# Patient Record
Sex: Male | Born: 1980 | Race: White | Hispanic: No | Marital: Married | Smoking: Never smoker
Health system: Southern US, Community
[De-identification: ages and names within clinical notes are randomized; demographics above are authoritative.]

## PROBLEM LIST (undated history)

## (undated) DIAGNOSIS — R0981 Nasal congestion: Secondary | ICD-10-CM

## (undated) HISTORY — PX: NASAL SINUS SURGERY: SHX719

## (undated) HISTORY — PX: APPENDECTOMY: SHX54

---

## 2005-05-29 ENCOUNTER — Ambulatory Visit: Payer: Self-pay | Admitting: Internal Medicine

## 2009-09-15 ENCOUNTER — Ambulatory Visit (HOSPITAL_COMMUNITY): Admission: EM | Admit: 2009-09-15 | Discharge: 2009-09-16 | Payer: Self-pay | Admitting: Emergency Medicine

## 2009-09-16 ENCOUNTER — Encounter (INDEPENDENT_AMBULATORY_CARE_PROVIDER_SITE_OTHER): Payer: Self-pay | Admitting: Surgery

## 2010-10-13 LAB — COMPREHENSIVE METABOLIC PANEL
ALT: 27 U/L (ref 0–53)
AST: 28 U/L (ref 0–37)
Albumin: 4 g/dL (ref 3.5–5.2)
Alkaline Phosphatase: 56 U/L (ref 39–117)
BUN: 9 mg/dL (ref 6–23)
CO2: 29 mEq/L (ref 19–32)
Calcium: 9.4 mg/dL (ref 8.4–10.5)
Chloride: 102 mEq/L (ref 96–112)
Creatinine, Ser: 1.02 mg/dL (ref 0.4–1.5)
GFR calc Af Amer: >60 mL/min (ref >60)
GFR calc non Af Amer: >60 mL/min (ref >60)
Glucose, Bld: 111 mg/dL — ABNORMAL HIGH (ref 70–99)
Potassium: 3.4 mEq/L — ABNORMAL LOW (ref 3.5–5.1)
Sodium: 140 mEq/L (ref 135–145)
Total Bilirubin: 1.3 mg/dL — ABNORMAL HIGH (ref 0.3–1.2)
Total Protein: 7.6 g/dL (ref 6.0–8.3)

## 2010-10-13 LAB — CBC
HCT: 45 % (ref 39.0–52.0)
Hemoglobin: 15.2 g/dL (ref 13.0–17.0)
MCHC: 33.7 g/dL (ref 30.0–36.0)
MCV: 91.4 fL (ref 78.0–100.0)
Platelets: 179 10*3/uL (ref 150–400)
RBC: 4.92 MIL/uL (ref 4.22–5.81)
RDW: 12.7 % (ref 11.5–15.5)
WBC: 9.8 10*3/uL (ref 4.0–10.5)

## 2010-10-13 LAB — URINALYSIS, ROUTINE W REFLEX MICROSCOPIC
Bilirubin Urine: NEGATIVE
Glucose, UA: NEGATIVE mg/dL
Hgb urine dipstick: NEGATIVE
Ketones, ur: NEGATIVE mg/dL
Nitrite: NEGATIVE
Protein, ur: NEGATIVE mg/dL
Specific Gravity, Urine: 1.004 — ABNORMAL LOW (ref 1.005–1.030)
Urobilinogen, UA: 0.2 mg/dL (ref 0.0–1.0)
pH: 6 (ref 5.0–8.0)

## 2010-10-13 LAB — DIFFERENTIAL
Basophils Absolute: 0 10*3/uL (ref 0.0–0.1)
Basophils Relative: 0 % (ref 0–1)
Eosinophils Absolute: 0.1 10*3/uL (ref 0.0–0.7)
Eosinophils Relative: 1 % (ref 0–5)
Lymphocytes Relative: 23 % (ref 12–46)
Lymphs Abs: 2.3 10*3/uL (ref 0.7–4.0)
Monocytes Absolute: 0.9 10*3/uL (ref 0.1–1.0)
Monocytes Relative: 10 % (ref 3–12)
Neutro Abs: 6.4 10*3/uL (ref 1.7–7.7)
Neutrophils Relative %: 66 % (ref 43–77)

## 2010-10-13 LAB — GLUCOSE, CAPILLARY: Glucose-Capillary: 110 mg/dL — ABNORMAL HIGH (ref 70–99)

## 2010-10-13 LAB — LIPASE, BLOOD: Lipase: 64 U/L — ABNORMAL HIGH (ref 11–59)

## 2016-08-07 DIAGNOSIS — Z0000 Encounter for general adult medical examination without abnormal findings: Secondary | ICD-10-CM | POA: Diagnosis not present

## 2016-08-07 DIAGNOSIS — Z3009 Encounter for other general counseling and advice on contraception: Secondary | ICD-10-CM | POA: Diagnosis not present

## 2016-08-07 DIAGNOSIS — B351 Tinea unguium: Secondary | ICD-10-CM | POA: Diagnosis not present

## 2016-09-19 DIAGNOSIS — Z3009 Encounter for other general counseling and advice on contraception: Secondary | ICD-10-CM | POA: Diagnosis not present

## 2016-10-11 DIAGNOSIS — L719 Rosacea, unspecified: Secondary | ICD-10-CM | POA: Diagnosis not present

## 2016-12-21 DIAGNOSIS — L503 Dermatographic urticaria: Secondary | ICD-10-CM | POA: Diagnosis not present

## 2017-05-23 DIAGNOSIS — J301 Allergic rhinitis due to pollen: Secondary | ICD-10-CM | POA: Diagnosis not present

## 2017-05-23 DIAGNOSIS — L209 Atopic dermatitis, unspecified: Secondary | ICD-10-CM | POA: Diagnosis not present

## 2017-05-23 DIAGNOSIS — J3081 Allergic rhinitis due to animal (cat) (dog) hair and dander: Secondary | ICD-10-CM | POA: Diagnosis not present

## 2017-05-23 DIAGNOSIS — J3089 Other allergic rhinitis: Secondary | ICD-10-CM | POA: Diagnosis not present

## 2017-12-11 NOTE — Progress Notes (Signed)
[facility] [address] [place], [place] [address] Phone: [phone] Subjective:     CC: Left arm pain  [id number]  Wayne [name] is a 37 y.o. male coming in with complaint of left arm numbness. He sleeps on his left arm and noticed the other day when using his cell phone that he had some tingling down his arm. Can pop his shoulder which causes tingling up into his neck. Intermittent symptoms. Also notes a tightness in the forearm. Patient is right handed.  States that sometimes the pain is severe 9 out of 10.  Can affect daily activities.    No past medical history on file.  Social History   Socioeconomic History  . Marital status: Married    Spouse name: Not on file  . Number of children: Not on file  . Years of education: Not on file  . Highest education level: Not on file  Occupational History  . Not on file  Social Needs  . Financial resource strain: Not on file  . Food insecurity:    Worry: Not on file    Inability: Not on file  . Transportation needs:    Medical: Not on file    Non-medical: Not on file  Tobacco Use  . Smoking status: Not on file  Substance and Sexual Activity  . Alcohol use: Not on file  . Drug use: Not on file  . Sexual activity: Not on file  Lifestyle  . Physical activity:    Days per week: Not on file    Minutes per session: Not on file  . Stress: Not on file  Relationships  . Social connections:    Talks on phone: Not on file    Gets together: Not on file    Attends religious service: Not on file    Active member of club or organization: Not on file    Attends meetings of clubs or organizations: Not on file    Relationship status: Not on file  Other Topics Concern  . Not on file  Social History Narrative  . Not on file   Allergies not on file No family history on file.  No family history of autoimmune   Past medical history, social, surgical and family history all reviewed in electronic medical  record.  No pertanent information unless stated regarding to the chief complaint.   Review of Systems:Review of systems updated and as accurate as of 12/12/17  No headache, visual changes, nausea, vomiting, diarrhea, constipation, dizziness, abdominal pain, skin rash, fevers, chills, night sweats, weight loss, swollen lymph nodes, body aches, joint swelling, muscle aches, chest pain, shortness of breath, mood changes.   Objective  Blood pressure 128/88, pulse 73, height  (1.778 m), weight 195 lb (88.5 kg), SpO2 99 %. Systems examined below as of 12/12/17   General: No apparent distress alert and oriented x3 mood and affect normal, dressed appropriately.  HEENT: Pupils equal, extraocular movements intact  Respiratory: Patient's speak in full sentences and does not appear short of breath  Cardiovascular: No lower extremity edema, non tender, no erythema  Skin: Warm dry intact with no signs of infection or rash on extremities or on axial skeleton.  Abdomen: Soft nontender  Neuro: Cranial nerves II through XII are intact, neurovascularly intact in all extremities with 2+ DTRs and 2+ pulses.  Lymph: No lymphadenopathy of posterior or anterior cervical chain or axillae bilaterally.  Gait normal with Minckler balance and coordination.  MSK:  Non tender with full range of motion and Taborda stability and symmetric strength and tone of  elbows, wrist, hip, knee and ankles bilaterally.   Neck: Inspection loss of lordosis. No palpable stepoffs. Mild positive Spurling's maneuver. Lacks last 5 degrees of sidebending to the left Significant weakness with 3 out of 5 strength of the C8 distribution on the left compared to the right No sensory change to C4 to T1 Negative Hoffman sign bilaterally Reflexes 1+ of the triceps on the left Severe winging of the scapula with scapular dyskinesis noted but otherwise shoulder exam unremarkable   Impression and Recommendations:     This case required medical  decision making of moderate complexity.      Note: This dictation was prepared with Dragon dictation along with smaller phrase technology. Any transcriptional errors that result from this process are unintentional.

## 2017-12-12 ENCOUNTER — Ambulatory Visit (INDEPENDENT_AMBULATORY_CARE_PROVIDER_SITE_OTHER)
Admission: RE | Admit: 2017-12-12 | Discharge: 2017-12-12 | Disposition: A | Discharge status: Home / Self Care | Payer: BLUE CROSS/BLUE SHIELD | Source: Ambulatory Visit | Attending: Family Medicine | Admitting: Family Medicine

## 2017-12-12 ENCOUNTER — Ambulatory Visit: Payer: BLUE CROSS/BLUE SHIELD | Admitting: Family Medicine

## 2017-12-12 VITALS — BP 128/88 | HR 73 | Ht 70.0 in | Wt 195.0 lb

## 2017-12-12 DIAGNOSIS — M25512 Pain in left shoulder: Secondary | ICD-10-CM | POA: Diagnosis not present

## 2017-12-12 DIAGNOSIS — M5412 Radiculopathy, cervical region: Secondary | ICD-10-CM

## 2017-12-12 DIAGNOSIS — G2589 Other specified extrapyramidal and movement disorders: Secondary | ICD-10-CM | POA: Insufficient documentation

## 2017-12-12 DIAGNOSIS — M542 Cervicalgia: Secondary | ICD-10-CM | POA: Diagnosis not present

## 2017-12-12 MED ORDER — GABAPENTIN 100 MG PO CAPS: 200.0000 mg | ORAL_CAPSULE | Freq: Every day | ORAL | 3 refills | Status: DC

## 2017-12-12 NOTE — Assessment & Plan Note (Signed)
Discussed instability and ergonomics.  We will give more precise exercises after radicular symptoms have improved

## 2017-12-12 NOTE — Assessment & Plan Note (Signed)
Patient does have cervical radiculopathy in the C8 distribution.  Weakness noted.  Mild decrease in deep tendon reflexes.  Patient unable to tolerate prednisone.  Will give Duexis instead in 4 to 6 days.  We discussed icing regimen, gabapentin at night.  Follow-up again in 1 week

## 2017-12-12 NOTE — Patient Instructions (Signed)
Barney to see you  Ice 20 minutes 2 times daily. Usually after activity and before bed. Xrays downstairs today  Duexis 3 times a day for 6 days Gabapentin  at night See me again in 1 week (ok to double book)

## 2017-12-17 NOTE — Progress Notes (Signed)
[facility] [address] [place], [place] [address] Phone: [phone] Subjective:    CC: neck pain   [id number]  Wayne [name] is a 36 y.o. male coming in with complaint of neck pain.  Patient was seen previously and had more of the C8 radicular symptoms with some weakness of the left hand.  Patient has been noncompliant has not picked up any of the medication.  Has been 1 week.  States though that he does feel like it is getting a little bit better.  Still has some of the weakness.  Still has pain at baseline.   X-rays were independently visualized by me x-rays of the neck are unremarkable  History reviewed. No pertinent past medical history. History reviewed. No pertinent surgical history. Social History   Socioeconomic History  . Marital status: Married    Spouse name: Not on file  . Number of children: Not on file  . Years of education: Not on file  . Highest education level: Not on file  Occupational History  . Not on file  Social Needs  . Financial resource strain: Not on file  . Food insecurity:    Worry: Not on file    Inability: Not on file  . Transportation needs:    Medical: Not on file    Non-medical: Not on file  Tobacco Use  . Smoking status: Never Smoker  . Smokeless tobacco: Never Used  Substance and Sexual Activity  . Alcohol use: Not on file  . Drug use: Not on file  . Sexual activity: Not on file  Lifestyle  . Physical activity:    Days per week: Not on file    Minutes per session: Not on file  . Stress: Not on file  Relationships  . Social connections:    Talks on phone: Not on file    Gets together: Not on file    Attends religious service: Not on file    Active member of club or organization: Not on file    Attends meetings of clubs or organizations: Not on file    Relationship status: Not on file  Other Topics Concern  . Not on file  Social History Narrative  . Not on file   Not on File History reviewed.  No pertinent family history.  No family history of autoimmune   Past medical history, social, surgical and family history all reviewed in electronic medical record.  No pertanent information unless stated regarding to the chief complaint.   Review of Systems:Review of systems updated and as accurate as of 12/18/17  No headache, visual changes, nausea, vomiting, diarrhea, constipation, dizziness, abdominal pain, skin rash, fevers, chills, night sweats, w chest pain, shortness of breath, mood changes.  Positive muscle aches  Objective  Blood pressure 122/80, pulse 75, height  (1.778 m), weight 198 lb (89.8 kg), SpO2 98 %. Systems examined below as of 12/18/17   General: No apparent distress alert and oriented x3 mood and affect normal, dressed appropriately.  HEENT: Pupils equal, extraocular movements intact  Respiratory: Patient's speak in full sentences and does not appear short of breath  Cardiovascular: No lower extremity edema, non tender, no erythema  Skin: Warm dry intact with no signs of infection or rash on extremities or on axial skeleton.  Abdomen: Soft nontender  Neuro: Cranial nerves II through XII are intact, neurovascularly intact in all extremities with 2+ DTRs and 2+ pulses.  Lymph: No lymphadenopathy of posterior or anterior cervical  chain or axillae bilaterally.  Gait normal with Biever balance and coordination.  MSK:  Non tender with full range of motion and Alper stability and symmetric strength and tone of shoulders, elbows, wrist, hip, knee and ankles bilaterally.  Neck: Inspection mild loss of lordosis. No palpable stepoffs. Negative Spurling's maneuver. Still lacking last 10 degrees of extension. Grip strength mildly weak on the left side Still weakness in the C8 distribution Negative Hoffman sign bilaterally Reflexes normal  Osteopathic findings C2 flexed rotated and side bent right C7 flexed rotated and side bent left T3 extended rotated and side bent  right inhaled third rib T8 extended rotated and side bent left L3 flexed rotated and side bent right Sacrum right on right    Impression and Recommendations:     This case required medical decision making of moderate complexity.      Note: This dictation was prepared with Dragon dictation along with smaller phrase technology. Any transcriptional errors that result from this process are unintentional.

## 2017-12-18 ENCOUNTER — Encounter: Payer: Self-pay | Admitting: Family Medicine

## 2017-12-18 ENCOUNTER — Ambulatory Visit: Payer: BLUE CROSS/BLUE SHIELD | Admitting: Family Medicine

## 2017-12-18 DIAGNOSIS — M999 Biomechanical lesion, unspecified: Secondary | ICD-10-CM | POA: Diagnosis not present

## 2017-12-18 DIAGNOSIS — M5412 Radiculopathy, cervical region: Secondary | ICD-10-CM | POA: Diagnosis not present

## 2017-12-18 NOTE — Patient Instructions (Signed)
Mcisaac to see you  Enjoy target  Try the gabapentin at night Tried manipulation today and I hope it helps Exercises 3 times a week.   See em again in 4 weeks

## 2017-12-18 NOTE — Assessment & Plan Note (Signed)
Decision today to treat with OMT was based on Physical Exam  After verbal consent patient was treated with HVLA, ME, FPR techniques in cervical, thoracic, lumbar and sacral areas  Patient tolerated the procedure well with improvement in symptoms  Patient given exercises, stretches and lifestyle modifications  See medications in patient instructions if given  Patient will follow up in 4 weeks 

## 2017-12-18 NOTE — Assessment & Plan Note (Signed)
Patient is making very mild improvement.  Continues to have weakness on the C8 distribution.  Continue to monitor.  Encourage the gabapentin at night to see if that will be beneficial.  Monitor the weakness.  Responded well to manipulation.  Follow-up again in 4 weeks

## 2018-01-29 NOTE — Progress Notes (Signed)
[facility] [address] [place], [place] [address] Phone: [phone] Subjective:     CC: Neck pain follow-up  BJY:[id number]:Subjective  Wayne [name] is a 37 y.o. male coming in with complaint of neck pain.  Patient was seen previously and had more of a cervical radiculopathy with some weakness in the C8 distribution on the left that was improving.  Patient did respond well to manipulation.  Continues to make some progress.  Denies any numbness or tingling and has not noticed any increasing weakness.     No past medical history on file. No past surgical history on file. Social History   Socioeconomic History  . Marital status: Married    Spouse name: Not on file  . Number of children: Not on file  . Years of education: Not on file  . Highest education level: Not on file  Occupational History  . Not on file  Social Needs  . Financial resource strain: Not on file  . Food insecurity:    Worry: Not on file    Inability: Not on file  . Transportation needs:    Medical: Not on file    Non-medical: Not on file  Tobacco Use  . Smoking status: Never Smoker  . Smokeless tobacco: Never Used  Substance and Sexual Activity  . Alcohol use: Not on file  . Drug use: Not on file  . Sexual activity: Not on file  Lifestyle  . Physical activity:    Days per week: Not on file    Minutes per session: Not on file  . Stress: Not on file  Relationships  . Social connections:    Talks on phone: Not on file    Gets together: Not on file    Attends religious service: Not on file    Active member of club or organization: Not on file    Attends meetings of clubs or organizations: Not on file    Relationship status: Not on file  Other Topics Concern  . Not on file  Social History Narrative  . Not on file   Not on File No family history on file.  No family history of autoimmune   Past medical history, social, surgical and family history all reviewed in electronic  medical record.  No pertanent information unless stated regarding to the chief complaint.   Review of Systems:Review of systems updated and as accurate as of 01/30/18  No headache, visual changes, nausea, vomiting, diarrhea, constipation, dizziness, abdominal pain, skin rash, fevers, chills, night sweats, weight loss, swollen lymph nodes, body aches, joint swelling, muscle aches, chest pain, shortness of breath, mood changes.   Objective  Blood pressure 120/78, pulse (!) 57, height 5\' 10"  (1.778 m), weight 191 lb (86.6 kg), SpO2 98 %. Systems examined below as of 01/30/18   General: No apparent distress alert and oriented x3 mood and affect normal, dressed appropriately.  HEENT: Pupils equal, extraocular movements intact  Respiratory: Patient's speak in full sentences and does not appear short of breath  Cardiovascular: No lower extremity edema, non tender, no erythema  Skin: Warm dry intact with no signs of infection or rash on extremities or on axial skeleton.  Abdomen: Soft nontender  Neuro: Cranial nerves II through XII are intact, neurovascularly intact in all extremities with 2+ DTRs and 2+ pulses.  Lymph: No lymphadenopathy of posterior or anterior cervical chain or axillae bilaterally.  Gait normal with Breeding balance and coordination.  MSK:  Non tender with full  range of motion and Gilbert stability and symmetric strength and tone of shoulders, elbows, wrist, hip, knee and ankles bilaterally.  Neck: Inspection mild loss of lordosis. No palpable stepoffs. Negative Spurling's maneuver. Mild limitation especially with left-sided rotation and sidebending Grip strength and sensation normal in bilateral hands Strength Przybylski C4 to T1 distribution No sensory change to C4 to T1 Negative Hoffman sign bilaterally Reflexes normal Tender to palpation little bit over the right trapezius  Osteopathic findings  C2 flexed rotated and side bent left  C6 flexed rotated and side bent left T3  extended rotated and side bent left inhaled third rib L2 flexed rotated and side bent right      Impression and Recommendations:     This case required medical decision making of moderate complexity.      Note: This dictation was prepared with Dragon dictation along with smaller phrase technology. Any transcriptional errors that result from this process are unintentional.

## 2018-01-30 ENCOUNTER — Ambulatory Visit: Payer: BLUE CROSS/BLUE SHIELD | Admitting: Family Medicine

## 2018-01-30 ENCOUNTER — Encounter: Payer: Self-pay | Admitting: Family Medicine

## 2018-01-30 VITALS — BP 120/78 | HR 57 | Ht 70.0 in | Wt 191.0 lb

## 2018-01-30 DIAGNOSIS — M999 Biomechanical lesion, unspecified: Secondary | ICD-10-CM | POA: Diagnosis not present

## 2018-01-30 DIAGNOSIS — M5412 Radiculopathy, cervical region: Secondary | ICD-10-CM

## 2018-01-30 NOTE — Assessment & Plan Note (Signed)
Decision today to treat with OMT was based on Physical Exam  After verbal consent patient was treated with HVLA, ME, FPR techniques in cervical, thoracic,  lumbarareas  Patient tolerated the procedure well with improvement in symptoms  Patient given exercises, stretches and lifestyle modifications  See medications in patient instructions if given  Patient will follow up in 6-8 weeks 

## 2018-01-30 NOTE — Patient Instructions (Signed)
Hershberger to see you  Keep it up  Gabapentin as needed See me again in 2 months

## 2018-01-30 NOTE — Assessment & Plan Note (Signed)
Improvement in strength noted.  Encourage patient to continue to take the gabapentin and patient is taking it more on an as-needed basis.  Patient has been doing relatively better and is responding well to osteopathic manipulation.  Patient will follow-up again in 6 to 8 weeks

## 2018-04-29 ENCOUNTER — Ambulatory Visit: Payer: BLUE CROSS/BLUE SHIELD | Admitting: Family Medicine

## 2018-04-29 ENCOUNTER — Encounter: Payer: Self-pay | Admitting: Family Medicine

## 2018-04-29 VITALS — BP 128/80 | HR 58 | Temp 98.0°F | Ht 70.0 in | Wt 196.0 lb

## 2018-04-29 DIAGNOSIS — H6981 Other specified disorders of Eustachian tube, right ear: Secondary | ICD-10-CM | POA: Diagnosis not present

## 2018-04-29 DIAGNOSIS — J069 Acute upper respiratory infection, unspecified: Secondary | ICD-10-CM | POA: Diagnosis not present

## 2018-04-29 NOTE — Progress Notes (Signed)
Subjective:  Patient ID: Wayne [name], male    DOB: 1981/04/13  Age: 37 y.o. MRN: [id number]  CC: Establish Care (right ear has drainage)   HPI Wayne [name] presents for evaluation of a 2-day history of nasal congestion postnasal drip with right ear congestion and discomfort.  He has run no fever or chills.  There is been no cough to speak he denies wheezing, nausea vomiting, facial pressure teeth pain.  He does have a history of ongoing allergy rhinitis.  He is allergic to a long list of things.  He had asthma as a child but has outgrown it.  He has not had issues with asthma as an adult.  He is scheduled for consultation for vasectomy.  He does not smoke.  His 57-year-old daughter is at home with an ear infection.  History Wayne [name] has no past medical history on file.   He has no past surgical history on file.   His family history is not on file.He reports that he has never smoked. He has never used smokeless tobacco. His alcohol and drug histories are not on file.  Outpatient Medications Prior to Visit  Medication Sig Dispense Refill  . cetirizine (ZYRTEC) 5 MG chewable tablet Chew 5 mg by mouth daily.    Marland Kitchen gabapentin (NEURONTIN) 100 MG capsule Take 2 capsules (200 mg total) by mouth at bedtime. 60 capsule 3   No facility-administered medications prior to visit.     ROS Review of Systems  Constitutional: Negative for chills, diaphoresis, fatigue, fever and unexpected weight change.  HENT: Positive for congestion, hearing loss, postnasal drip, sneezing and sore throat. Negative for ear discharge, ear pain, rhinorrhea, sinus pressure, sinus pain and trouble swallowing.   Eyes: Negative for photophobia and visual disturbance.  Respiratory: Negative for cough, chest tightness and wheezing.   Cardiovascular: Negative.   Endocrine: Negative for polyphagia and polyuria.  Genitourinary: Negative.   Musculoskeletal: Negative for arthralgias and myalgias.  Skin: Negative.   Allergic/Immunologic:  Negative for immunocompromised state.  Neurological: Negative for light-headedness and headaches.  Hematological: Does not bruise/bleed easily.  Psychiatric/Behavioral: Negative.     Objective:  BP 128/80   Pulse (!) 58   Temp 98 F (36.7 C) (Oral)   Ht 5\' 10"  (1.778 m)   Wt 196 lb (88.9 kg)   SpO2 97%   BMI 28.12 kg/m   Physical Exam  Constitutional: He is oriented to person, place, and time. He appears well-developed and well-nourished. No distress.  HENT:  Head: Normocephalic and atraumatic.  Right Ear: External ear normal. Tympanic membrane is scarred and retracted. Tympanic membrane is not injected, not perforated and not erythematous.  Left Ear: External ear normal. Tympanic membrane is not injected, not scarred, not perforated, not erythematous and not retracted.  Mouth/Throat: Oropharynx is clear and moist. No oropharyngeal exudate.  Eyes: Pupils are equal, round, and reactive to light. Conjunctivae and EOM are normal. Right eye exhibits no discharge. Left eye exhibits no discharge. No scleral icterus.  Neck: Neck supple. No JVD present. No tracheal deviation present. No thyromegaly present.  Cardiovascular: Normal rate, regular rhythm and normal heart sounds.  Pulmonary/Chest: Effort normal and breath sounds normal. No respiratory distress. He has no wheezes. He has no rales.  Abdominal: Bowel sounds are normal.  Lymphadenopathy:    He has no cervical adenopathy.  Neurological: He is alert and oriented to person, place, and time.  Skin: Skin is warm and dry. No rash noted. He is not diaphoretic. No erythema.  Psychiatric: He has a normal mood and affect. His behavior is normal.      Assessment & Plan:   Semir was seen today for establish care.  Diagnoses and all orders for this visit:  Viral upper respiratory tract infection  Dysfunction of right eustachian tube   I have discontinued Wayne [name]'s cetirizine and gabapentin.  No orders of the defined types were  placed in this encounter.  Patient will use a decongestant.  Discussed options of Mucinex D versus zyrtec D.  He will follow-up in a week if he does not improve or if things get worse.  He will follow-up at some point after his vasectomy for physical exam.  Discussed the need for him to follow-up in the future with any significant wheezing for evaluation.  Follow-up: Return if symptoms worsen or fail to improve.  Mliss Sax, MD

## 2018-04-29 NOTE — Patient Instructions (Signed)
Eustachian Tube Dysfunction The eustachian tube connects the middle ear to the back of the nose. It regulates air pressure in the middle ear by allowing air to move between the ear and nose. It also helps to drain fluid from the middle ear space. When the eustachian tube does not function properly, air pressure, fluid, or both can build up in the middle ear. Eustachian tube dysfunction can affect one or both ears. What are the causes? This condition happens when the eustachian tube becomes blocked or cannot open normally. This may result from:  Ear infections.  Colds and other upper respiratory infections.  Allergies.  Irritation, such as from cigarette smoke or acid from the stomach coming up into the esophagus (gastroesophageal reflux).  Sudden changes in air pressure, such as from descending in an airplane.  Abnormal growths in the nose or throat, such as nasal polyps, tumors, or enlarged tissue at the back of the throat (adenoids).  What increases the risk? This condition may be more likely to develop in people who smoke and people who are overweight. Eustachian tube dysfunction may also be more likely to develop in children, especially children who have:  Certain birth defects of the mouth, such as cleft palate.  Large tonsils and adenoids.  What are the signs or symptoms? Symptoms of this condition may include:  A feeling of fullness in the ear.  Ear pain.  Clicking or popping noises in the ear.  Ringing in the ear.  Hearing loss.  Loss of balance.  Symptoms may get worse when the air pressure around you changes, such as when you travel to an area of high elevation or fly on an airplane. How is this diagnosed? This condition may be diagnosed based on:  Your symptoms.  A physical exam of your ear, nose, and throat.  Tests, such as those that measure: ? The movement of your eardrum (tympanogram). ? Your hearing (audiometry).  How is this treated? Treatment  depends on the cause and severity of your condition. If your symptoms are mild, you may be able to relieve your symptoms by moving air into ("popping") your ears. If you have symptoms of fluid in your ears, treatment may include:  Decongestants.  Antihistamines.  Nasal sprays or ear drops that contain medicines that reduce swelling (steroids).  In some cases, you may need to have a procedure to drain the fluid in your eardrum (myringotomy). In this procedure, a small tube is placed in the eardrum to:  Drain the fluid.  Restore the air in the middle ear space.  Follow these instructions at home:  Take over-the-counter and prescription medicines only as told by your health care provider.  Use techniques to help pop your ears as recommended by your health care provider. These may include: ? Chewing gum. ? Yawning. ? Frequent, forceful swallowing. ? Closing your mouth, holding your nose closed, and gently blowing as if you are trying to blow air out of your nose.  Do not do any of the following until your health care provider approves: ? Travel to high altitudes. ? Fly in airplanes. ? Work in a pressurized cabin or room. ? Scuba dive.  Keep your ears dry. Dry your ears completely after showering or bathing.  Do not smoke.  Keep all follow-up visits as told by your health care provider. This is important. Contact a health care provider if:  Your symptoms do not go away after treatment.  Your symptoms come back after treatment.  You are   unable to pop your ears.  You have: ? A fever. ? Pain in your ear. ? Pain in your head or neck. ? Fluid draining from your ear.  Your hearing suddenly changes.  You become very dizzy.  You lose your balance. This information is not intended to replace advice given to you by your health care provider. Make sure you discuss any questions you have with your health care provider. Document Released: 08/05/2015 Document Revised: 12/15/2015  Document Reviewed: 07/28/2014 Elsevier Interactive Patient Education  2018 [employer]  Viral Respiratory Infection A respiratory infection is an illness that affects part of the respiratory system, such as the lungs, nose, or throat. Most respiratory infections are caused by either viruses or bacteria. A respiratory infection that is caused by a virus is called a viral respiratory infection. Common types of viral respiratory infections include:  A cold.  The flu (influenza).  A respiratory syncytial virus (RSV) infection.  How do I know if I have a viral respiratory infection? Most viral respiratory infections cause:  A stuffy or runny nose.  Yellow or green nasal discharge.  A cough.  Sneezing.  Fatigue.  Achy muscles.  A sore throat.  Sweating or chills.  A fever.  A headache.  How are viral respiratory infections treated? If influenza is diagnosed early, it may be treated with an antiviral medicine that shortens the length of time a person has symptoms. Symptoms of viral respiratory infections may be treated with over-the-counter and prescription medicines, such as:  Expectorants. These make it easier to cough up mucus.  Decongestant nasal sprays.  Health care providers do not prescribe antibiotic medicines for viral infections. This is because antibiotics are designed to kill bacteria. They have no effect on viruses. How do I know if I should stay home from work or school? To avoid exposing others to your respiratory infection, stay home if you have:  A fever.  A persistent cough.  A sore throat.  A runny nose.  Sneezing.  Muscles aches.  Headaches.  Fatigue.  Weakness.  Chills.  Sweating.  Nausea.  Follow these instructions at home:  Rest as much as possible.  Take over-the-counter and prescription medicines only as told by your health care provider.  Drink enough fluid to keep your urine clear or pale yellow. This helps prevent  dehydration and helps loosen up mucus.  Gargle with a salt-water mixture 3-4 times per day or as needed. To make a salt-water mixture, completely dissolve -1 tsp of salt in 1 cup of warm water.  Use nose drops made from salt water to ease congestion and soften raw skin around your nose.  Do not drink alcohol.  Do not use tobacco products, including cigarettes, chewing tobacco, and e-cigarettes. If you need help quitting, ask your health care provider. Contact a health care provider if:  Your symptoms last for 10 days or longer.  Your symptoms get worse over time.  You have a fever.  You have severe sinus pain in your face or forehead.  The glands in your jaw or neck become very swollen. Get help right away if:  You feel pain or pressure in your chest.  You have shortness of breath.  You faint or feel like you will faint.  You have severe and persistent vomiting.  You feel confused or disoriented. This information is not intended to replace advice given to you by your health care provider. Make sure you discuss any questions you have with your health care  provider. Document Released: 04/18/2005 Document Revised: 12/15/2015 Document Reviewed: 12/15/2014 Elsevier Interactive Patient Education  [employer].

## 2018-05-01 DIAGNOSIS — Z3009 Encounter for other general counseling and advice on contraception: Secondary | ICD-10-CM | POA: Diagnosis not present

## 2018-07-07 NOTE — Progress Notes (Signed)
[facility] [address] [place], [place] [address] Phone: [phone] Subjective:   Wayne [name], am serving as a scribe for Dr. Antoine Primas.  I'm seeing this patient by the request  of: Mliss Sax, MD   CC: Neck pain  [id number]  Wayne [name] is a 37 y.o. male coming in with complaint of neck pain. Pain is more on the right side and is sharp. Has increased over the past 2 days. Has been wearing different shoes recently that hurt his lower back and he is wondering if this is transferring up the chain to his cervical spine.     No past medical history on file. No past surgical history on file. Social History   Socioeconomic History  . Marital status: Married    Spouse name: Not on file  . Number of children: Not on file  . Years of education: Not on file  . Highest education level: Not on file  Occupational History  . Not on file  Social Needs  . Financial resource strain: Not on file  . Food insecurity:    Worry: Not on file    Inability: Not on file  . Transportation needs:    Medical: Not on file    Non-medical: Not on file  Tobacco Use  . Smoking status: Never Smoker  . Smokeless tobacco: Never Used  Substance and Sexual Activity  . Alcohol use: Not on file  . Drug use: Not on file  . Sexual activity: Not on file  Lifestyle  . Physical activity:    Days per week: Not on file    Minutes per session: Not on file  . Stress: Not on file  Relationships  . Social connections:    Talks on phone: Not on file    Gets together: Not on file    Attends religious service: Not on file    Active member of club or organization: Not on file    Attends meetings of clubs or organizations: Not on file    Relationship status: Not on file  Other Topics Concern  . Not on file  Social History Narrative  . Not on file   Not on File No family history on file. No current outpatient medications on file.    Past medical history,  social, surgical and family history all reviewed in electronic medical record.  No pertanent information unless stated regarding to the chief complaint.   Review of Systems:  No headache, visual changes, nausea, vomiting, diarrhea, constipation, dizziness, abdominal pain, skin rash, fevers, chills, night sweats, weight loss, swollen lymph nodes, body aches, joint swelling,chest pain, shortness of breath, mood changes.  Positive muscle aches  Objective  Blood pressure (!) 138/98, pulse 73, height 5\' 10"  (1.778 m), weight 199 lb (90.3 kg), SpO2 97 %.   General: No apparent distress alert and oriented x3 mood and affect normal, dressed appropriately.  HEENT: Pupils equal, extraocular movements intact  Respiratory: Patient's speak in full sentences and does not appear short of breath  Cardiovascular: No lower extremity edema, non tender, no erythema  Skin: Warm dry intact with no signs of infection or rash on extremities or on axial skeleton.  Abdomen: Soft nontender  Neuro: Cranial nerves II through XII are intact, neurovascularly intact in all extremities with 2+ DTRs and 2+ pulses.  Lymph: No lymphadenopathy of posterior or anterior cervical chain or axillae bilaterally.  Gait normal with Hopping balance and coordination.  MSK:  Non  tender with full range of motion and Manni stability and symmetric strength and tone of shoulders, elbows, wrist, hip, knee and ankles bilaterally.  Neck: Inspection loss of lordosis. No palpable stepoffs. Negative Spurling's maneuver. Mild loss of lordosis from different areas.  5 to 10 degrees in all planes Grip strength and sensation normal in bilateral hands Strength Canino C4 to T1 distribution No sensory change to C4 to T1 Negative Hoffman sign bilaterally Reflexes normal Tenderness to palpation in the right trapezius  Osteopathic findings C2 flexed rotated and side bent right C4 flexed rotated and side bent left T9 extended rotated and side bent left L4  flexed rotated and side bent right Sacrum right on right     Impression and Recommendations:     . The above documentation has been reviewed and is accurate and complete Judi SaaZachary M Smith, DO       Note: This dictation was prepared with Dragon dictation along with smaller phrase technology. Any transcriptional errors that result from this process are unintentional.

## 2018-07-08 ENCOUNTER — Ambulatory Visit: Payer: BLUE CROSS/BLUE SHIELD | Admitting: Family Medicine

## 2018-07-08 ENCOUNTER — Encounter: Payer: Self-pay | Admitting: Family Medicine

## 2018-07-08 VITALS — BP 138/98 | HR 73 | Ht 70.0 in | Wt 199.0 lb

## 2018-07-08 DIAGNOSIS — M999 Biomechanical lesion, unspecified: Secondary | ICD-10-CM | POA: Diagnosis not present

## 2018-07-08 DIAGNOSIS — M5412 Radiculopathy, cervical region: Secondary | ICD-10-CM

## 2018-07-08 NOTE — Assessment & Plan Note (Signed)
No radicular symptoms.  Discussed HEP, posture, responded well to the manipulation.  Patient will do more lifting on a regular basis.  Follow-up with me again 4 to 6 weeks if necessary.

## 2018-07-08 NOTE — Assessment & Plan Note (Signed)
Decision today to treat with OMT was based on Physical Exam  After verbal consent patient was treated with HVLA, ME, FPR techniques in cervical, thoracic, lumbar and sacral areas  Patient tolerated the procedure well with improvement in symptoms  Patient given exercises, stretches and lifestyle modifications  See medications in patient instructions if given  Patient will follow up in 4-6 weeks 

## 2018-07-08 NOTE — Patient Instructions (Signed)
Dalia to see you  Ice is your friend Stay active I am here if you need me

## 2018-07-10 DIAGNOSIS — Z302 Encounter for sterilization: Secondary | ICD-10-CM | POA: Diagnosis not present

## 2018-08-08 ENCOUNTER — Ambulatory Visit: Payer: BLUE CROSS/BLUE SHIELD | Admitting: Family

## 2018-09-15 ENCOUNTER — Ambulatory Visit: Payer: Self-pay | Admitting: Family Medicine

## 2018-09-15 NOTE — Telephone Encounter (Signed)
BP was taken by EMT at his place of employment 170/100  Patients states no other symptoms except feel "loopy" and "hands heavy" / Patient states he was trying not to start medication, but does have a history of Hypertension / Appointment made for tomorrow.    Reason for Disposition . Systolic BP  >= 160 OR Diastolic >= 100  Answer Assessment - Initial Assessment Questions 1. BLOOD PRESSURE: "What is the blood pressure?" "Did you take at least two measurements 5 minutes apart?"     Last BP was 170/100 2. ONSET: "When did you take your blood pressure?" today 3. HOW: "How did you obtain the blood pressure?" (e.g., visiting nurse, automatic home BP monitor)    EMT at work 4. HISTORY: "Do you have a history of high blood pressure?" yes 5. MEDICATIONS: "Are you taking any medications for blood pressure?" "Have you missed any doses recently?" No medication 6. OTHER SYMPTOMS: "Do you have any symptoms?" (e.g., headache, chest pain, blurred vision, difficulty breathing, weakness)    Patient does c/o weakness "walking on cloud 9" loopy  Protocols used: HIGH BLOOD PRESSURE-A-AH

## 2018-09-16 ENCOUNTER — Ambulatory Visit: Payer: BLUE CROSS/BLUE SHIELD | Admitting: Family Medicine

## 2018-09-16 ENCOUNTER — Encounter: Payer: Self-pay | Admitting: Family Medicine

## 2018-09-16 VITALS — BP 130/80 | HR 77 | Ht 70.0 in | Wt 199.5 lb

## 2018-09-16 DIAGNOSIS — Z0000 Encounter for general adult medical examination without abnormal findings: Secondary | ICD-10-CM | POA: Diagnosis not present

## 2018-09-16 LAB — COMPREHENSIVE METABOLIC PANEL
ALT: 29 U/L (ref 0–53)
AST: 22 U/L (ref 0–37)
Albumin: 4.8 g/dL (ref 3.5–5.2)
Alkaline Phosphatase: 38 U/L — ABNORMAL LOW (ref 39–117)
BUN: 9 mg/dL (ref 6–23)
CO2: 29 mEq/L (ref 19–32)
Calcium: 9.6 mg/dL (ref 8.4–10.5)
Chloride: 100 mEq/L (ref 96–112)
Creatinine, Ser: 1.07 mg/dL (ref 0.40–1.50)
GFR: 77.8 mL/min (ref >60.00)
Glucose, Bld: 88 mg/dL (ref 70–99)
Potassium: 3.5 mEq/L (ref 3.5–5.1)
Sodium: 139 mEq/L (ref 135–145)
Total Bilirubin: 2.2 mg/dL — ABNORMAL HIGH (ref 0.2–1.2)
Total Protein: 7.6 g/dL (ref 6.0–8.3)

## 2018-09-16 LAB — CBC
HCT: 44.8 % (ref 39.0–52.0)
Hemoglobin: 15.3 g/dL (ref 13.0–17.0)
MCHC: 34.2 g/dL (ref 30.0–36.0)
MCV: 92.3 fl (ref 78.0–100.0)
Platelets: 199 10*3/uL (ref 150.0–400.0)
RBC: 4.85 Mil/uL (ref 4.22–5.81)
RDW: 12.9 % (ref 11.5–15.5)
WBC: 4.9 10*3/uL (ref 4.0–10.5)

## 2018-09-16 LAB — LIPID PANEL
Cholesterol: 176 mg/dL (ref 0–200)
HDL: 43.2 mg/dL (ref >39.00)
LDL Cholesterol: 93 mg/dL (ref 0–99)
NonHDL: 132.86
Total CHOL/HDL Ratio: 4
Triglycerides: 198 mg/dL — ABNORMAL HIGH (ref 0.0–149.0)
VLDL: 39.6 mg/dL (ref 0.0–40.0)

## 2018-09-16 LAB — URINALYSIS, ROUTINE W REFLEX MICROSCOPIC
Bilirubin Urine: NEGATIVE
Hgb urine dipstick: NEGATIVE
Ketones, ur: NEGATIVE
Leukocytes,Ua: NEGATIVE
Nitrite: NEGATIVE
Specific Gravity, Urine: <=1.005 — AB (ref 1.000–1.030)
Total Protein, Urine: NEGATIVE
Urine Glucose: NEGATIVE
Urobilinogen, UA: 0.2 (ref 0.0–1.0)
WBC, UA: NONE SEEN — AB (ref 0–?)
pH: 6.5 (ref 5.0–8.0)

## 2018-09-16 NOTE — Patient Instructions (Signed)
Health Maintenance, Male A healthy lifestyle and preventive care is important for your health and wellness. Ask your health care provider about what schedule of regular examinations is right for you. What should I know about weight and diet? Eat a Healthy Diet  Eat plenty of vegetables, fruits, whole grains, low-fat dairy products, and lean protein.  Do not eat a lot of foods high in solid fats, added sugars, or salt.  Maintain a Healthy Weight Regular exercise can help you achieve or maintain a healthy weight. You should:  Do at least 150 minutes of exercise each week. The exercise should increase your heart rate and make you sweat (moderate-intensity exercise).  Do strength-training exercises at least twice a week. Watch Your Levels of Cholesterol and Blood Lipids  Have your blood tested for lipids and cholesterol every 5 years starting at 37 years of age. If you are at high risk for heart disease, you should start having your blood tested when you are 37 years old. You may need to have your cholesterol levels checked more often if: ? Your lipid or cholesterol levels are high. ? You are older than 37 years of age. ? You are at high risk for heart disease. What should I know about cancer screening? Many types of cancers can be detected early and may often be prevented. Lung Cancer  You should be screened every year for lung cancer if: ? You are a current smoker who has smoked for at least 30 years. ? You are a former smoker who has quit within the past 15 years.  Talk to your health care provider about your screening options, when you should start screening, and how often you should be screened. Colorectal Cancer  Routine colorectal cancer screening usually begins at 37 years of age and should be repeated every 5-10 years until you are 37 years old. You may need to be screened more often if early forms of precancerous polyps or small growths are found. Your health care provider may  recommend screening at an earlier age if you have risk factors for colon cancer.  Your health care provider may recommend using home test kits to check for hidden blood in the stool.  A small camera at the end of a tube can be used to examine your colon (sigmoidoscopy or colonoscopy). This checks for the earliest forms of colorectal cancer. Prostate and Testicular Cancer  Depending on your age and overall health, your health care provider may do certain tests to screen for prostate and testicular cancer.  Talk to your health care provider about any symptoms or concerns you have about testicular or prostate cancer. Skin Cancer  Check your skin from head to toe regularly.  Tell your health care provider about any new moles or changes in moles, especially if: ? There is a change in a mole's size, shape, or color. ? You have a mole that is larger than a pencil eraser.  Always use sunscreen. Apply sunscreen liberally and repeat throughout the day.  Protect yourself by wearing long sleeves, pants, a wide-brimmed hat, and sunglasses when outside. What should I know about heart disease, diabetes, and high blood pressure?  If you are 18-39 years of age, have your blood pressure checked every 3-5 years. If you are 40 years of age or older, have your blood pressure checked every year. You should have your blood pressure measured twice-once when you are at a hospital or clinic, and once when you are not at a hospital   or clinic. Record the average of the two measurements. To check your blood pressure when you are not at a hospital or clinic, you can use: ? An automated blood pressure machine at a pharmacy. ? A home blood pressure monitor.  Talk to your health care provider about your target blood pressure.  If you are between 32-22 years old, ask your health care provider if you should take aspirin to prevent heart disease.  Have regular diabetes screenings by checking your fasting blood sugar  level. ? If you are at a normal weight and have a low risk for diabetes, have this test once every three years after the age of 45. ? If you are overweight and have a high risk for diabetes, consider being tested at a younger age or more often.  A one-time screening for abdominal aortic aneurysm (AAA) by ultrasound is recommended for men aged 64-75 years who are current or former smokers. What should I know about preventing infection? Hepatitis B If you have a higher risk for hepatitis B, you should be screened for this virus. Talk with your health care provider to find out if you are at risk for hepatitis B infection. Hepatitis C Blood testing is recommended for:  Everyone born from [birth year] through 1964.  Anyone with known risk factors for hepatitis C. Sexually Transmitted Diseases (STDs)  You should be screened each year for STDs including gonorrhea and chlamydia if: ? You are sexually active and are younger than 38 years of age. ? You are older than 37 years of age and your health care provider tells you that you are at risk for this type of infection. ? Your sexual activity has changed since you were last screened and you are at an increased risk for chlamydia or gonorrhea. Ask your health care provider if you are at risk.  Talk with your health care provider about whether you are at high risk of being infected with HIV. Your health care provider may recommend a prescription medicine to help prevent HIV infection. What else can I do?  Schedule regular health, dental, and eye exams.  Stay current with your vaccines (immunizations).  Do not use any tobacco products, such as cigarettes, chewing tobacco, and e-cigarettes. If you need help quitting, ask your health care provider.  Limit alcohol intake to no more than 2 drinks per day. One drink equals 12 ounces of beer, 5 ounces of wine, or 1 ounces of hard liquor.  Do not use street drugs.  Do not share needles.  Ask your health  care provider for help if you need support or information about quitting drugs.  Tell your health care provider if you often feel depressed.  Tell your health care provider if you have ever been abused or do not feel safe at home. This information is not intended to replace advice given to you by your health care provider. Make sure you discuss any questions you have with your health care provider. Document Released: 01/05/2008 Document Revised: 03/07/2016 Document Reviewed: 04/12/2015 Elsevier Interactive Patient Education  2019 [place] DASH stands for "Dietary Approaches to Stop Hypertension." The DASH eating plan is a healthy eating plan that has been shown to reduce high blood pressure (hypertension). It may also reduce your risk for type 2 diabetes, heart disease, and stroke. The DASH eating plan may also help with weight loss. What are tips for following this plan?  General guidelines  Avoid eating more than 2,300 mg (milligrams)  of salt (sodium) a day. If you have hypertension, you may need to reduce your sodium intake to 1,500 mg a day.  Limit alcohol intake to no more than 1 drink a day for nonpregnant women and 2 drinks a day for men. One drink equals 12 oz of beer, 5 oz of wine, or 1 oz of hard liquor.  Work with your health care provider to maintain a healthy body weight or to lose weight. Ask what an ideal weight is for you.  Get at least 30 minutes of exercise that causes your heart to beat faster (aerobic exercise) most days of the week. Activities may include walking, swimming, or biking.  Work with your health care provider or diet and nutrition specialist (dietitian) to adjust your eating plan to your individual calorie needs. Reading food labels   Check food labels for the amount of sodium per serving. Choose foods with less than 5 percent of the Daily Value of sodium. Generally, foods with less than 300 mg of sodium per serving fit into this  eating plan.  To find whole grains, look for the word "whole" as the first word in the ingredient list. Shopping  Buy products labeled as "low-sodium" or "no salt added."  Buy fresh foods. Avoid canned foods and premade or frozen meals. Cooking  Avoid adding salt when cooking. Use salt-free seasonings or herbs instead of table salt or sea salt. Check with your health care provider or pharmacist before using salt substitutes.  Do not fry foods. Cook foods using healthy methods such as baking, boiling, grilling, and broiling instead.  Cook with heart-healthy oils, such as olive, canola, soybean, or sunflower oil. Meal planning  Eat a balanced diet that includes: ? 5 or more servings of fruits and vegetables each day. At each meal, try to fill half of your plate with fruits and vegetables. ? Up to 6-8 servings of whole grains each day. ? Less than 6 oz of lean meat, poultry, or fish each day. A 3-oz serving of meat is about the same size as a deck of cards. One egg equals 1 oz. ? 2 servings of low-fat dairy each day. ? A serving of nuts, seeds, or beans 5 times each week. ? Heart-healthy fats. Healthy fats called Omega-3 fatty acids are found in foods such as flaxseeds and coldwater fish, like sardines, salmon, and mackerel.  Limit how much you eat of the following: ? Canned or prepackaged foods. ? Food that is high in trans fat, such as fried foods. ? Food that is high in saturated fat, such as fatty meat. ? Sweets, desserts, sugary drinks, and other foods with added sugar. ? Full-fat dairy products.  Do not salt foods before eating.  Try to eat at least 2 vegetarian meals each week.  Eat more home-cooked food and less restaurant, buffet, and fast food.  When eating at a restaurant, ask that your food be prepared with less salt or no salt, if possible. What foods are recommended? The items listed may not be a complete list. Talk with your dietitian about what dietary choices are  best for you. Grains Whole-grain or whole-wheat bread. Whole-grain or whole-wheat pasta. Brown rice. [place] Morrow. Bulgur. Whole-grain and low-sodium cereals. Pita bread. Low-fat, low-sodium crackers. Whole-wheat flour tortillas. Vegetables Fresh or frozen vegetables (raw, steamed, roasted, or grilled). Low-sodium or reduced-sodium tomato and vegetable juice. Low-sodium or reduced-sodium tomato sauce and tomato paste. Low-sodium or reduced-sodium canned vegetables. Fruits All fresh, dried, or frozen fruit. Canned  fruit in natural juice (without added sugar). Meat and other protein foods Skinless chicken or [place]. Ground chicken or [place]. Pork with fat trimmed off. Fish and seafood. Egg whites. Dried beans, peas, or lentils. Unsalted nuts, nut butters, and seeds. Unsalted canned beans. Lean cuts of beef with fat trimmed off. Low-sodium, lean deli meat. Dairy Low-fat (1%) or fat-free (skim) milk. Fat-free, low-fat, or reduced-fat cheeses. Nonfat, low-sodium ricotta or cottage cheese. Low-fat or nonfat yogurt. Low-fat, low-sodium cheese. Fats and oils Soft margarine without trans fats. Vegetable oil. Low-fat, reduced-fat, or light mayonnaise and salad dressings (reduced-sodium). Canola, safflower, olive, soybean, and sunflower oils. Avocado. Seasoning and other foods Herbs. Spices. Seasoning mixes without salt. Unsalted popcorn and pretzels. Fat-free sweets. What foods are not recommended? The items listed may not be a complete list. Talk with your dietitian about what dietary choices are best for you. Grains Baked goods made with fat, such as croissants, muffins, or some breads. Dry pasta or rice meal packs. Vegetables Creamed or fried vegetables. Vegetables in a cheese sauce. Regular canned vegetables (not low-sodium or reduced-sodium). Regular canned tomato sauce and paste (not low-sodium or reduced-sodium). Regular tomato and vegetable juice (not low-sodium or reduced-sodium). Rosita Fire.  Olives. Fruits Canned fruit in a light or heavy syrup. Fried fruit. Fruit in cream or butter sauce. Meat and other protein foods Fatty cuts of meat. Ribs. Fried meat. Tomasa Blase. Sausage. Bologna and other processed lunch meats. Salami. Fatback. Hotdogs. Bratwurst. Salted nuts and seeds. Canned beans with added salt. Canned or smoked fish. Whole eggs or egg yolks. Chicken or [place] with skin. Dairy Whole or 2% milk, cream, and half-and-half. Whole or full-fat cream cheese. Whole-fat or sweetened yogurt. Full-fat cheese. Nondairy creamers. Whipped toppings. Processed cheese and cheese spreads. Fats and oils Butter. Stick margarine. Lard. Shortening. Ghee. Bacon fat. Tropical oils, such as coconut, palm kernel, or palm oil. Seasoning and other foods Salted popcorn and pretzels. Onion salt, garlic salt, seasoned salt, table salt, and sea salt. Worcestershire sauce. Tartar sauce. Barbecue sauce. Teriyaki sauce. Soy sauce, including reduced-sodium. Steak sauce. Canned and packaged gravies. Fish sauce. Oyster sauce. Cocktail sauce. Horseradish that you find on the shelf. Ketchup. Mustard. Meat flavorings and tenderizers. Bouillon cubes. Hot sauce and Tabasco sauce. Premade or packaged marinades. Premade or packaged taco seasonings. Relishes. Regular salad dressings. Where to find more information:  National Heart, Lung, and Blood Institute: PopSteam.is  American Heart Association: www.heart.org Summary  The DASH eating plan is a healthy eating plan that has been shown to reduce high blood pressure (hypertension). It may also reduce your risk for type 2 diabetes, heart disease, and stroke.  With the DASH eating plan, you should limit salt (sodium) intake to 2,300 mg a day. If you have hypertension, you may need to reduce your sodium intake to 1,500 mg a day.  When on the DASH eating plan, aim to eat more fresh fruits and vegetables, whole grains, lean proteins, low-fat dairy, and heart-healthy  fats.  Work with your health care provider or diet and nutrition specialist (dietitian) to adjust your eating plan to your individual calorie needs. This information is not intended to replace advice given to you by your health care provider. Make sure you discuss any questions you have with your health care provider. Document Released: 06/28/2011 Document Revised: 07/02/2016 Document Reviewed: 07/02/2016 Elsevier Interactive Patient Education  2019 Else[employer] Inc[employer]  Preventing Hypertension Hypertension, commonly called high blood pressure, is when the force of blood pumping through the arteries is too strong.  Arteries are blood vessels that carry blood from the heart throughout the body. Over time, hypertension can damage the arteries and decrease blood flow to important parts of the body, including the brain, heart, and kidneys. Often, hypertension does not cause symptoms until blood pressure is very high. For this reason, it is important to have your blood pressure checked on a regular basis. Hypertension can often be prevented with diet and lifestyle changes. If you already have hypertension, you can control it with diet and lifestyle changes, as well as medicine. What nutrition changes can be made? Maintain a healthy diet. This includes:  Eating less salt (sodium). Ask your health care provider how much sodium is safe for you to have. The general recommendation is to consume less than 1 tsp (2,300 mg) of sodium a day. ? Do not add salt to your food. ? Choose low-sodium options when grocery shopping and eating out.  Limiting fats in your diet. You can do this by eating low-fat or fat-free dairy products and by eating less red meat.  Eating more fruits, vegetables, and whole grains. Make a goal to eat: ? 1-2 cups of fresh fruits and vegetables each day. ? 3-4 servings of whole grains each day.  Avoiding foods and beverages that have added sugars.  Eating fish that contain healthy fats  (omega-3 fatty acids), such as mackerel or salmon. If you need help putting together a healthy eating plan, try the DASH diet. This diet is high in fruits, vegetables, and whole grains. It is low in sodium, red meat, and added sugars. DASH stands for Dietary Approaches to Stop Hypertension. What lifestyle changes can be made?   Lose weight if you are overweight. Losing just 3?5% of your body weight can help prevent or control hypertension. ? For example, if your present weight is 200 lb (91 kg), a loss of 3-5% of your weight means losing 6-10 lb (2.7-4.5 kg). ? Ask your health care provider to help you with a diet and exercise plan to safely lose weight.  Get enough exercise. Do at least 150 minutes of moderate-intensity exercise each week. ? You could do this in short exercise sessions several times a day, or you could do longer exercise sessions a few times a week. For example, you could take a brisk 10-minute walk or bike ride, 3 times a day, for 5 days a week.  Find ways to reduce stress, such as exercising, meditating, listening to music, or taking a yoga class. If you need help reducing stress, ask your health care provider.  Do not smoke. This includes e-cigarettes. Chemicals in tobacco and nicotine products raise your blood pressure each time you smoke. If you need help quitting, ask your health care provider.  Avoid alcohol. If you drink alcohol, limit alcohol intake to no more than 1 drink a day for nonpregnant women and 2 drinks a day for men. One drink equals 12 oz of beer, 5 oz of wine, or 1 oz of hard liquor. Why are these changes important? Diet and lifestyle changes can help you prevent hypertension, and they may make you feel better overall and improve your quality of life. If you have hypertension, making these changes will help you control it and help prevent major complications, such as:  Hardening and narrowing of arteries that supply blood to: ? Your heart. This can cause  a heart attack. ? Your brain. This can cause a stroke. ? Your kidneys. This can cause kidney failure.  Stress on  your heart muscle, which can cause heart failure. What can I do to lower my risk?  Work with your health care provider to make a hypertension prevention plan that works for you. Follow your plan and keep all follow-up visits as told by your health care provider.  Learn how to check your blood pressure at home. Make sure that you know your personal target blood pressure, as told by your health care provider. How is this treated? In addition to diet and lifestyle changes, your health care provider may recommend medicines to help lower your blood pressure. You may need to try a few different medicines to find what works best for you. You also may need to take more than one medicine. Take over-the-counter and prescription medicines only as told by your health care provider. Where to find support Your health care provider can help you prevent hypertension and help you keep your blood pressure at a healthy level. Your local hospital or your community may also provide support services and prevention programs. The American Heart Association offers an online support network at: https://www.lee.net/ Where to find more information Learn more about hypertension from:  National Heart, Lung, and Blood Institute: https://www.peterson.org/  Centers for Disease Control and Prevention: AboutHD.co.nz  American Academy of Family Physicians: http://familydoctor.org/familydoctor/en/diseases-conditions/high-blood-pressure.printerview.all.html Learn more about the DASH diet from:  National Heart, Lung, and Blood Institute: WedMap.it Contact a health care provider if:  You think you are having a reaction to medicines you have taken.  You have recurrent headaches or feel dizzy.  You have  swelling in your ankles.  You have trouble with your vision. Summary  Hypertension often does not cause any symptoms until blood pressure is very high. It is important to get your blood pressure checked regularly.  Diet and lifestyle changes are the most important steps in preventing hypertension.  By keeping your blood pressure in a healthy range, you can prevent complications like heart attack, heart failure, stroke, and kidney failure.  Work with your health care provider to make a hypertension prevention plan that works for you. This information is not intended to replace advice given to you by your health care provider. Make sure you discuss any questions you have with your health care provider. Document Released: 07/24/2015 Document Revised: 03/19/2016 Document Reviewed: 03/19/2016 Elsevier Interactive Patient Education  2019 [employer].

## 2018-09-16 NOTE — Progress Notes (Signed)
Established Patient Office Visit  Subjective:  Patient ID: Wayne [name], male    DOB: 1980/08/16  Age: 38 y.o. MRN: [id number]  CC:  Chief Complaint  Patient presents with  . Hypertension    HPI Wayne [name] presents for follow-up status post recent spike in his blood pressure of 170/100.  This is been associated with a stressful event in his life.  He is a Production designer, theatre/television/film at a [employer], [employer] and had to terminate an employee after an investigation necessitated that action.  Patient has no prior history of hypertension.  Medical review shows occasional elevated blood pressures.  Patient has no symptoms of high blood pressure to include headaches blurred vision or lightheadedness.  Patient's father is 59 and has high blood pressure.  Patient does not smoke or use illicit drugs.  On occasion patient will have 4-6 servings of alcohol in a given setting.  He tells me that his wife is developing concerned about his drinking.  He is 0 out of 4 on the cage.  He lives with his wife and 20 and 65-year-old children.  Patient is status post dilated eye exam this past week.  There was no hypertensive retinopathy noted on that exam. History reviewed. No pertinent past medical history.  History reviewed. No pertinent surgical history.  History reviewed. No pertinent family history.  Social History   Socioeconomic History  . Marital status: Married    Spouse name: Not on file  . Number of children: Not on file  . Years of education: Not on file  . Highest education level: Not on file  Occupational History  . Not on file  Social Needs  . Financial resource strain: Not on file  . Food insecurity:    Worry: Not on file    Inability: Not on file  . Transportation needs:    Medical: Not on file    Non-medical: Not on file  Tobacco Use  . Smoking status: Never Smoker  . Smokeless tobacco: Never Used  Substance and Sexual Activity  . Alcohol use: Not on file  . Drug use: Not on file  . Sexual activity: Not  on file  Lifestyle  . Physical activity:    Days per week: Not on file    Minutes per session: Not on file  . Stress: Not on file  Relationships  . Social connections:    Talks on phone: Not on file    Gets together: Not on file    Attends religious service: Not on file    Active member of club or organization: Not on file    Attends meetings of clubs or organizations: Not on file    Relationship status: Not on file  . Intimate partner violence:    Fear of current or ex partner: Not on file    Emotionally abused: Not on file    Physically abused: Not on file    Forced sexual activity: Not on file  Other Topics Concern  . Not on file  Social History Narrative  . Not on file    No outpatient medications prior to visit.   No facility-administered medications prior to visit.     Not on File  ROS Review of Systems  Constitutional: Negative for chills, diaphoresis, fatigue, fever and unexpected weight change.  HENT: Negative.   Eyes: Negative for photophobia and visual disturbance.  Respiratory: Negative for chest tightness and shortness of breath.   Cardiovascular: Negative for chest pain and palpitations.  Gastrointestinal: Negative.  Endocrine: Negative for polyphagia and polyuria.  Genitourinary: Negative for difficulty urinating, hematuria and urgency.  Musculoskeletal: Negative for arthralgias and myalgias.  Skin: Negative for pallor and rash.  Neurological: Negative for dizziness, light-headedness, numbness and headaches.  Hematological: Does not bruise/bleed easily.  Psychiatric/Behavioral: Negative.       Objective:    Physical Exam  Constitutional: He is oriented to person, place, and time. He appears well-developed and well-nourished. No distress.  HENT:  Head: Normocephalic and atraumatic.  Right Ear: External ear normal.  Left Ear: External ear normal.  Mouth/Throat: Oropharynx is clear and moist. No oropharyngeal exudate.  Eyes: Pupils are equal,  round, and reactive to light. Conjunctivae are normal. Right eye exhibits no discharge. Left eye exhibits no discharge.  Neck: Neck supple. No JVD present. No tracheal deviation present. No thyromegaly present.  Cardiovascular: Normal rate, regular rhythm and normal heart sounds.  Pulmonary/Chest: Effort normal and breath sounds normal. No stridor.  Abdominal: Bowel sounds are normal.  Lymphadenopathy:    He has no cervical adenopathy.  Neurological: He is alert and oriented to person, place, and time.  Skin: Skin is warm and dry. He is not diaphoretic.  Psychiatric: He has a normal mood and affect. His behavior is normal.    BP 130/80   Pulse 77   Ht  (1.778 m)   Wt 199 lb 8 oz (90.5 kg)   SpO2 95%   BMI 28.63 kg/m  Wt Readings from Last 3 Encounters:  09/16/18 199 lb 8 oz (90.5 kg)  07/08/18 199 lb (90.3 kg)  04/29/18 196 lb (88.9 kg)   BP Readings from Last 3 Encounters:  09/16/18 130/80  07/08/18 (!) 138/98  04/29/18 128/80   Guideline developer:  UpToDate (see UpToDate for funding source) Date Released: June 2014  Health Maintenance Due  Topic Date Due  . HIV Screening  10/14/1996  . TETANUS/TDAP  10/14/2000  . INFLUENZA VACCINE  02/20/2018    There are no preventive care reminders to display for this patient.  No results found for: TSH Lab Results  Component Value Date   WBC 9.8 09/15/2009   HGB 15.2 09/15/2009   HCT 45.0 09/15/2009   MCV 91.4 09/15/2009   PLT 179 09/15/2009   Lab Results  Component Value Date   NA 140 09/15/2009   K 3.4 (L) 09/15/2009   CO2 29 09/15/2009   GLUCOSE 111 (H) 09/15/2009   BUN 9 09/15/2009   CREATININE 1.02 09/15/2009   BILITOT 1.3 (H) 09/15/2009   ALKPHOS 56 09/15/2009   AST 28 09/15/2009   ALT 27 09/15/2009   PROT 7.6 09/15/2009   ALBUMIN 4.0 09/15/2009   CALCIUM 9.4 09/15/2009   No results found for: CHOL No results found for: HDL No results found for: LDLCALC No results found for: TRIG No results found  for: CHOLHDL No results found for: WUJW1X    Assessment & Plan:   Problem List Items Addressed This Visit      Other   Healthcare maintenance - Primary   Relevant Orders   CBC   Comprehensive metabolic panel   Lipid panel   Urinalysis, Routine w reflex microscopic      No orders of the defined types were placed in this encounter.   Follow-up: Return in about 6 months (around 03/17/2019), or if symptoms worsen or fail to improve.   Fasting labs drawn today.  Patient was given information on health maintenance and disease prevention.  He was also given information  on the DASH diet and preventing hypertension.  Patient was asked to start checking his blood pressures and follow-up if the start running greater than 140/90 range.  Otherwise we will see him back in 6 months.  We discussed the impact that alcohol can have on his blood pressure.  Recommended no more than 2 servings of alcohol in a day.

## 2018-11-12 DIAGNOSIS — L2089 Other atopic dermatitis: Secondary | ICD-10-CM | POA: Diagnosis not present

## 2018-11-17 ENCOUNTER — Telehealth: Payer: Self-pay | Admitting: Family Medicine

## 2018-11-17 NOTE — Telephone Encounter (Signed)
I called and left message on patient voicemail to call office and schedule follow up appointment with Dr. Kremer.  °

## 2019-01-06 DIAGNOSIS — L820 Inflamed seborrheic keratosis: Secondary | ICD-10-CM | POA: Diagnosis not present

## 2019-01-06 DIAGNOSIS — L718 Other rosacea: Secondary | ICD-10-CM | POA: Diagnosis not present

## 2019-01-06 DIAGNOSIS — L508 Other urticaria: Secondary | ICD-10-CM | POA: Diagnosis not present

## 2019-01-06 DIAGNOSIS — L218 Other seborrheic dermatitis: Secondary | ICD-10-CM | POA: Diagnosis not present

## 2019-06-09 ENCOUNTER — Encounter: Payer: Self-pay | Admitting: Family Medicine

## 2019-06-09 ENCOUNTER — Other Ambulatory Visit: Payer: Self-pay

## 2019-06-09 ENCOUNTER — Ambulatory Visit (INDEPENDENT_AMBULATORY_CARE_PROVIDER_SITE_OTHER): Payer: BC Managed Care – PPO | Admitting: Family Medicine

## 2019-06-09 DIAGNOSIS — H938X2 Other specified disorders of left ear: Secondary | ICD-10-CM | POA: Diagnosis not present

## 2019-06-09 DIAGNOSIS — R0981 Nasal congestion: Secondary | ICD-10-CM | POA: Insufficient documentation

## 2019-06-09 DIAGNOSIS — R0982 Postnasal drip: Secondary | ICD-10-CM | POA: Diagnosis not present

## 2019-06-09 MED ORDER — FLUTICASONE PROPIONATE 50 MCG/ACT NA SUSP: 2.0000 | Freq: Every day | NASAL | 6 refills | Status: AC

## 2019-06-09 MED ORDER — PREDNISONE 10 MG (21) PO TBPK: ORAL_TABLET | ORAL | 0 refills | Status: DC

## 2019-06-09 NOTE — Progress Notes (Addendum)
Established Patient Office Visit  Subjective:  Patient ID: Wayne [name], male    DOB: 26-May-1981  Age: 38 y.o. MRN: [id number]  CC:  Chief Complaint  Patient presents with  . mucus build up in throat    HPI Wayne [name] presents for evaluation and treatment of a 32-month history of developing nasal congestion with postnasal drip and left ear congestion.  It is seem to worsen here over the last week or so.  Drainage has been clear.  There is been no fever chills cough or wheezing.  Patient has a history of nasal polyposis treated with surgical polypectomy 7 years ago.  At this time he denies facial pressure or teeth pain.  He has been treating it with a Nettie pot and Mucinex.  Symptoms seem to worsen after he turned the heat on his home.  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  He has been wearing a mask when he cuts this.  History reviewed. No pertinent past medical history.  History reviewed. No pertinent surgical history.  History reviewed. No pertinent family history.  Social History   Socioeconomic History  . Marital status: Married    Spouse name: Not on file  . Number of children: Not on file  . Years of education: Not on file  . Highest education level: Not on file  Occupational History  . Not on file  Social Needs  . Financial resource strain: Not on file  . Food insecurity    Worry: Not on file    Inability: Not on file  . Transportation needs    Medical: Not on file    Non-medical: Not on file  Tobacco Use  . Smoking status: Never Smoker  . Smokeless tobacco: Never Used  Substance and Sexual Activity  . Alcohol use: Not on file  . Drug use: Not on file  . Sexual activity: Not on file  Lifestyle  . Physical activity    Days per week: Not on file    Minutes per session: Not on file  . Stress: Not on file  Relationships  . Social Herbalist on phone: Not on file    Gets together: Not on file    Attends religious  service: Not on file    Active member of club or organization: Not on file    Attends meetings of clubs or organizations: Not on file    Relationship status: Not on file  . Intimate partner violence    Fear of current or ex partner: Not on file    Emotionally abused: Not on file    Physically abused: Not on file    Forced sexual activity: Not on file  Other Topics Concern  . Not on file  Social History Narrative  . Not on file    No outpatient medications prior to visit.   No facility-administered medications prior to visit.     Not on File  ROS Review of Systems  Constitutional: Negative for chills, diaphoresis, fatigue, fever and unexpected weight change.  HENT: Positive for congestion and postnasal drip. Negative for ear pain, rhinorrhea, sinus pressure, sinus pain and sore throat.   Eyes: Negative for photophobia and visual disturbance.  Respiratory: Negative for cough, choking, shortness of breath and wheezing.   Cardiovascular: Negative.   Gastrointestinal: Negative.   Musculoskeletal: Negative for arthralgias and myalgias.  Allergic/Immunologic: Negative for immunocompromised state.  Neurological: Negative for headaches.  Hematological:  Does not bruise/bleed easily.  Psychiatric/Behavioral: Negative.       Objective:    Physical Exam  Constitutional: He is oriented to person, place, and time. He appears well-developed and well-nourished. No distress.  HENT:  Head: Normocephalic and atraumatic.  Right Ear: External ear normal.  Left Ear: External ear normal.  Eyes: Conjunctivae are normal. Right eye exhibits no discharge. Left eye exhibits no discharge. No scleral icterus.  Neck: No JVD present. No tracheal deviation present.  Pulmonary/Chest: Effort normal. No stridor.  Neurological: He is alert and oriented to person, place, and time.  Skin: Skin is warm and dry. He is not diaphoretic.  Psychiatric: He has a normal mood and affect. His behavior is normal.     There were no vitals taken for this visit. Wt Readings from Last 3 Encounters:  09/16/18 199 lb 8 oz (90.5 kg)  07/08/18 199 lb (90.3 kg)  04/29/18 196 lb (88.9 kg)   BP Readings from Last 3 Encounters:  09/16/18 130/80  07/08/18 (!) 138/98  04/29/18 128/80   Guideline developer:  UpToDate (see UpToDate for funding source) Date Released: June 2014  Health Maintenance Due  Topic Date Due  . HIV Screening  10/14/1996  . TETANUS/TDAP  10/14/2000  . INFLUENZA VACCINE  02/21/2019    There are no preventive care reminders to display for this patient.  No results found for: TSH Lab Results  Component Value Date   WBC 4.9 09/16/2018   HGB 15.3 09/16/2018   HCT 44.8 09/16/2018   MCV 92.3 09/16/2018   PLT 199.0 09/16/2018   Lab Results  Component Value Date   NA 139 09/16/2018   K 3.5 09/16/2018   CO2 29 09/16/2018   GLUCOSE 88 09/16/2018   BUN 9 09/16/2018   CREATININE 1.07 09/16/2018   BILITOT 2.2 (H) 09/16/2018   ALKPHOS 38 (L) 09/16/2018   AST 22 09/16/2018   ALT 29 09/16/2018   PROT 7.6 09/16/2018   ALBUMIN 4.8 09/16/2018   CALCIUM 9.6 09/16/2018   GFR 77.80 09/16/2018   Lab Results  Component Value Date   CHOL 176 09/16/2018   Lab Results  Component Value Date   HDL 43.20 09/16/2018   Lab Results  Component Value Date   LDLCALC 93 09/16/2018   Lab Results  Component Value Date   TRIG 198.0 (H) 09/16/2018   Lab Results  Component Value Date   CHOLHDL 4 09/16/2018   No results found for: HGBA1C    Assessment & Plan:   Problem List Items Addressed This Visit      Nervous and Auditory   Congestion of left ear   Relevant Medications   predniSONE (STERAPRED UNI-PAK 21 TAB) 10 MG (21) TBPK tablet   fluticasone (FLONASE) 50 MCG/ACT nasal spray   Other Relevant Orders   Ambulatory referral to ENT     Other   Post-nasal drip   Relevant Medications   predniSONE (STERAPRED UNI-PAK 21 TAB) 10 MG (21) TBPK tablet   fluticasone (FLONASE) 50  MCG/ACT nasal spray   Other Relevant Orders   Ambulatory referral to ENT   Nasal congestion - Primary   Relevant Medications   predniSONE (STERAPRED UNI-PAK 21 TAB) 10 MG (21) TBPK tablet   fluticasone (FLONASE) 50 MCG/ACT nasal spray   Other Relevant Orders   Ambulatory referral to ENT      Meds ordered this encounter  Medications  . predniSONE (STERAPRED UNI-PAK 21 TAB) 10 MG (21) TBPK tablet    Sig:  Take 6 today, 5 tomorrow, 4 the next day and then 3, 2, 1 and stop    Dispense:  21 tablet    Refill:  0  . fluticasone (FLONASE) 50 MCG/ACT nasal spray    Sig: Place 2 sprays into both nostrils daily.    Dispense:  16 g    Refill:  6    Follow-up: Return if symptoms worsen or fail to improve.    Virtual Visit via Video Note  I connected with Lisabeth Pick on 06/09/19 at  8:30 AM EST by a video enabled telemedicine application and verified that I am speaking with the correct person using two identifiers.  Patient and myself participated in the call.   Location: Patient: work   Provider:    I discussed the limitations of evaluation and management by telemedicine and the availability of in person appointments. The patient expressed understanding and agreed to proceed.  History of Present Illness:    Observations/Objective:   Assessment and Plan:   Follow Up Instructions:    I discussed the assessment and treatment plan with the patient. The patient was provided an opportunity to ask questions and all were answered. The patient agreed with the plan and demonstrated an understanding of the instructions.   The patient was advised to call back or seek an in-person evaluation if the symptoms worsen or if the condition fails to improve as anticipated.  I provided 20 minutes of non-face-to-face time during this encounter.   Mliss Sax, MD

## 2019-06-10 DIAGNOSIS — Z20828 Contact with and (suspected) exposure to other viral communicable diseases: Secondary | ICD-10-CM | POA: Diagnosis not present

## 2019-06-16 DIAGNOSIS — J343 Hypertrophy of nasal turbinates: Secondary | ICD-10-CM | POA: Diagnosis not present

## 2019-06-16 DIAGNOSIS — J310 Chronic rhinitis: Secondary | ICD-10-CM | POA: Diagnosis not present

## 2019-07-20 ENCOUNTER — Other Ambulatory Visit: Payer: Self-pay

## 2019-07-20 ENCOUNTER — Ambulatory Visit (INDEPENDENT_AMBULATORY_CARE_PROVIDER_SITE_OTHER): Payer: BC Managed Care – PPO | Admitting: Family Medicine

## 2019-07-20 ENCOUNTER — Encounter: Payer: Self-pay | Admitting: Family Medicine

## 2019-07-20 VITALS — Ht 70.0 in

## 2019-07-20 DIAGNOSIS — K30 Functional dyspepsia: Secondary | ICD-10-CM | POA: Diagnosis not present

## 2019-07-20 DIAGNOSIS — R140 Abdominal distension (gaseous): Secondary | ICD-10-CM | POA: Diagnosis not present

## 2019-07-20 NOTE — Progress Notes (Signed)
Established Patient Office Visit  Subjective:  Patient ID: Wayne [name], male    DOB: 06/29/1981  Age: 38 y.o. MRN: [id number]  CC:  Chief Complaint  Patient presents with  . Pain    c/o burping, some stomach pains on left side that goes to his mid back symptoms x 2 days, started probiotics and nexium yesterday    HPI Wayne [name] presents for evaluation and treatment for a 2 to 3-day history of midepigastric pain associated with abdominal bloating, increased burping and flatulence.  Symptoms responded to Tums.  Patient has experienced some burning in the chest.  There is been no fever chills cough chest pain shortness of breath or dyspnea.  Symptoms were preceded by 4 alcoholic drinks on the evening prior.  Adenopathy abdominal pain has not been severe.  There is been no blood in the stool or melena.  Denies constipation.  History of possible lactose intolerance.  He did have a small amount of crab dip with cream cheese in it that evening before.  He recently started Nexium.  He is currently in [place] for the new year.  No past medical history on file.  No past surgical history on file.  No family history on file.  Social History   Socioeconomic History  . Marital status: Married    Spouse name: Not on file  . Number of children: Not on file  . Years of education: Not on file  . Highest education level: Not on file  Occupational History  . Not on file  Tobacco Use  . Smoking status: Never Smoker  . Smokeless tobacco: Never Used  Substance and Sexual Activity  . Alcohol use: Not on file  . Drug use: Not on file  . Sexual activity: Not on file  Other Topics Concern  . Not on file  Social History Narrative  . Not on file   Social Determinants of Health   Financial Resource Strain:   . Difficulty of Paying Living Expenses: Not on file  Food Insecurity:   . Worried About Programme researcher, broadcasting/film/video in the Last Year: Not on file  . Ran Out of Food in the Last Year: Not on file    Transportation Needs:   . Lack of Transportation (Medical): Not on file  . Lack of Transportation (Non-Medical): Not on file  Physical Activity:   . Days of Exercise per Week: Not on file  . Minutes of Exercise per Session: Not on file  Stress:   . Feeling of Stress : Not on file  Social Connections:   . Frequency of Communication with Friends and Family: Not on file  . Frequency of Social Gatherings with Friends and Family: Not on file  . Attends Religious Services: Not on file  . Active Member of Clubs or Organizations: Not on file  . Attends Banker Meetings: Not on file  . Marital Status: Not on file  Intimate Partner Violence:   . Fear of Current or Ex-Partner: Not on file  . Emotionally Abused: Not on file  . Physically Abused: Not on file  . Sexually Abused: Not on file    Outpatient Medications Prior to Visit  Medication Sig Dispense Refill  . fluticasone (FLONASE) 50 MCG/ACT nasal spray Place 2 sprays into both nostrils daily. 16 g 6  . predniSONE (STERAPRED UNI-PAK 21 TAB) 10 MG (21) TBPK tablet Take 6 today, 5 tomorrow, 4 the next day and then 3, 2, 1 and stop (Patient not taking:  Reported on 07/20/2019) 21 tablet 0   No facility-administered medications prior to visit.    No Known Allergies  ROS Review of Systems  Constitutional: Negative for diaphoresis, fatigue, fever and unexpected weight change.  HENT: Negative.   Eyes: Negative for photophobia and visual disturbance.  Respiratory: Negative for chest tightness, shortness of breath and wheezing.   Cardiovascular: Negative for chest pain and palpitations.  Gastrointestinal: Positive for abdominal distention. Negative for abdominal pain, anal bleeding, blood in stool, nausea and vomiting.  Genitourinary: Negative.   Musculoskeletal: Negative for gait problem and joint swelling.  Allergic/Immunologic: Negative for immunocompromised state.  Neurological: Negative for light-headedness and numbness.   Hematological: Negative.   Psychiatric/Behavioral: Negative.       Objective:    Physical Exam  Constitutional: He is oriented to person, place, and time. No distress.  Pulmonary/Chest: Effort normal.  Neurological: He is alert and oriented to person, place, and time.  Psychiatric: He has a normal mood and affect. His behavior is normal.    Ht 5\' 10"  (1.778 m)   BMI 28.63 kg/m  Wt Readings from Last 3 Encounters:  09/16/18 199 lb 8 oz (90.5 kg)  07/08/18 199 lb (90.3 kg)  04/29/18 196 lb (88.9 kg)     Health Maintenance Due  Topic Date Due  . HIV Screening  10/14/1996  . TETANUS/TDAP  10/14/2000  . INFLUENZA VACCINE  02/21/2019    There are no preventive care reminders to display for this patient.  No results found for: TSH Lab Results  Component Value Date   WBC 4.9 09/16/2018   HGB 15.3 09/16/2018   HCT 44.8 09/16/2018   MCV 92.3 09/16/2018   PLT 199.0 09/16/2018   Lab Results  Component Value Date   NA 139 09/16/2018   K 3.5 09/16/2018   CO2 29 09/16/2018   GLUCOSE 88 09/16/2018   BUN 9 09/16/2018   CREATININE 1.07 09/16/2018   BILITOT 2.2 (H) 09/16/2018   ALKPHOS 38 (L) 09/16/2018   AST 22 09/16/2018   ALT 29 09/16/2018   PROT 7.6 09/16/2018   ALBUMIN 4.8 09/16/2018   CALCIUM 9.6 09/16/2018   GFR 77.80 09/16/2018   Lab Results  Component Value Date   CHOL 176 09/16/2018   Lab Results  Component Value Date   HDL 43.20 09/16/2018   Lab Results  Component Value Date   LDLCALC 93 09/16/2018   Lab Results  Component Value Date   TRIG 198.0 (H) 09/16/2018   Lab Results  Component Value Date   CHOLHDL 4 09/16/2018   No results found for: HGBA1C    Assessment & Plan:   Problem List Items Addressed This Visit      Other   Indigestion - Primary    Other Visit Diagnoses    Abdominal bloating          No orders of the defined types were placed in this encounter.   Follow-up: Return if symptoms worsen or fail to improve.   Instructed patient to continue the Nexium and add Gas-X as needed.  Increase hydration and avoid further alcohol.  He will be seen in an emergency room if not improving or worsening over the next few days.  Virtual Visit via Video Note  I connected with Wayne [name] on 07/20/19 at 10:00 AM EST by a video enabled telemedicine application and verified that I am speaking with the correct person using two identifiers.  Location: Patient: with his wife vacationing in [place] Provider:  I discussed the limitations of evaluation and management by telemedicine and the availability of in person appointments. The patient expressed understanding and agreed to proceed.  History of Present Illness:    Observations/Objective:   Assessment and Plan:   Follow Up Instructions:    I discussed the assessment and treatment plan with the patient. The patient was provided an opportunity to ask questions and all were answered. The patient agreed with the plan and demonstrated an understanding of the instructions.   The patient was advised to call back or seek an in-person evaluation if the symptoms worsen or if the condition fails to improve as anticipated.  I provided 20 minutes of non-face-to-face time during this encounter.  Interactive video and audio telecommunications were attempted between myself and the patient. However they failed due to the patient having technical difficulties or not having access to video capability. We continued and completed with audio only.  Mliss SaxWilliam Alfred Breuna Loveall, MD   Mliss SaxWilliam Alfred Yoltzin Barg, MD

## 2019-07-28 DIAGNOSIS — J343 Hypertrophy of nasal turbinates: Secondary | ICD-10-CM | POA: Diagnosis not present

## 2019-07-28 DIAGNOSIS — R0982 Postnasal drip: Secondary | ICD-10-CM | POA: Diagnosis not present

## 2019-07-28 DIAGNOSIS — J310 Chronic rhinitis: Secondary | ICD-10-CM | POA: Diagnosis not present

## 2019-09-23 ENCOUNTER — Ambulatory Visit: Payer: BC Managed Care – PPO | Attending: Internal Medicine

## 2019-09-23 DIAGNOSIS — Z20822 Contact with and (suspected) exposure to covid-19: Secondary | ICD-10-CM | POA: Diagnosis not present

## 2019-09-24 LAB — NOVEL CORONAVIRUS, NAA: SARS-CoV-2, NAA: NOT DETECTED

## 2019-10-02 ENCOUNTER — Ambulatory Visit: Payer: BC Managed Care – PPO | Attending: Internal Medicine

## 2019-10-02 DIAGNOSIS — Z20822 Contact with and (suspected) exposure to covid-19: Secondary | ICD-10-CM | POA: Diagnosis not present

## 2019-10-03 LAB — NOVEL CORONAVIRUS, NAA: SARS-CoV-2, NAA: NOT DETECTED

## 2020-01-14 ENCOUNTER — Encounter (HOSPITAL_BASED_OUTPATIENT_CLINIC_OR_DEPARTMENT_OTHER): Payer: Self-pay | Admitting: Emergency Medicine

## 2020-01-14 ENCOUNTER — Emergency Department (HOSPITAL_BASED_OUTPATIENT_CLINIC_OR_DEPARTMENT_OTHER): Payer: BC Managed Care – PPO

## 2020-01-14 ENCOUNTER — Emergency Department (HOSPITAL_BASED_OUTPATIENT_CLINIC_OR_DEPARTMENT_OTHER)
Admission: EM | Admit: 2020-01-14 | Discharge: 2020-01-14 | Disposition: A | Discharge status: Home / Self Care | Payer: BC Managed Care – PPO | Attending: Emergency Medicine | Admitting: Emergency Medicine

## 2020-01-14 ENCOUNTER — Other Ambulatory Visit: Payer: Self-pay

## 2020-01-14 DIAGNOSIS — R079 Chest pain, unspecified: Secondary | ICD-10-CM | POA: Diagnosis not present

## 2020-01-14 DIAGNOSIS — R12 Heartburn: Secondary | ICD-10-CM

## 2020-01-14 DIAGNOSIS — R0789 Other chest pain: Secondary | ICD-10-CM | POA: Diagnosis not present

## 2020-01-14 HISTORY — DX: Nasal congestion: R09.81

## 2020-01-14 LAB — CBC
HCT: 43.6 % (ref 39.0–52.0)
Hemoglobin: 15.1 g/dL (ref 13.0–17.0)
MCH: 31.4 pg (ref 26.0–34.0)
MCHC: 34.6 g/dL (ref 30.0–36.0)
MCV: 90.6 fL (ref 80.0–100.0)
Platelets: 189 10*3/uL (ref 150–400)
RBC: 4.81 MIL/uL (ref 4.22–5.81)
RDW: 12.4 % (ref 11.5–15.5)
WBC: 5.1 10*3/uL (ref 4.0–10.5)
nRBC: 0 % (ref 0.0–0.2)

## 2020-01-14 LAB — BASIC METABOLIC PANEL
Anion gap: 10 (ref 5–15)
BUN: 9 mg/dL (ref 6–20)
CO2: 27 mmol/L (ref 22–32)
Calcium: 9.9 mg/dL (ref 8.9–10.3)
Chloride: 103 mmol/L (ref 98–111)
Creatinine, Ser: 1.11 mg/dL (ref 0.61–1.24)
GFR calc Af Amer: >60 mL/min (ref >60)
GFR calc non Af Amer: >60 mL/min (ref >60)
Glucose, Bld: 104 mg/dL — ABNORMAL HIGH (ref 70–99)
Potassium: 3.8 mmol/L (ref 3.5–5.1)
Sodium: 140 mmol/L (ref 135–145)

## 2020-01-14 LAB — TROPONIN I (HIGH SENSITIVITY): Troponin I (High Sensitivity): 2 ng/L (ref <18)

## 2020-01-14 MED ORDER — LIDOCAINE VISCOUS HCL 2 % MT SOLN
15.0000 mL | Freq: Once | OROMUCOSAL | Status: AC
  Administered 2020-01-14: 15 mL via ORAL
  Filled 2020-01-14: qty 15

## 2020-01-14 MED ORDER — ALUM & MAG HYDROXIDE-SIMETH 200-200-20 MG/5ML PO SUSP
30.0000 mL | Freq: Once | ORAL | Status: AC
  Administered 2020-01-14: 30 mL via ORAL
  Filled 2020-01-14: qty 30

## 2020-01-14 MED ORDER — PANTOPRAZOLE SODIUM 20 MG PO TBEC: 20.0000 mg | DELAYED_RELEASE_TABLET | Freq: Every day | ORAL | 0 refills | Status: DC

## 2020-01-14 MED FILL — PANTOPRAZOLE SOD DR 20 MG T: 20 | 30 days supply | Qty: 30 | Fill #0

## 2020-01-14 NOTE — ED Provider Notes (Signed)
[facility] EMERGENCY DEPARTMENT Provider Note   CSN: [id number] Arrival date & time: 01/14/20  1326     History Chief Complaint  Patient presents with  . Chest Pain    Wayne [name] is a 39 y.o. male.  Presents emergency department chief complaint of chest pain and limb heaviness.  Patient reports self diagnosed history of acid reflux.  In December 2020 the patient was experiencing severe reflux symptoms.  He states that he would have burning and belching at night, difficulty sleeping.  He diagnosed himself as this was during the Covid pandemic and was unable to go see a physician and patient.  He began taking Prilosec and probiotics and had significant improvement in his symptoms and has not had problems since.  He does report that he has ongoing digestive issues regularly including frequent belching.  2 nights ago the patient went out to eat.  When he got home he drank [place] mist and states that he had severe reflux symptoms severe difficulty sleeping.  He took some Tylenol and Tums and was finally able to get to sleep.  Yesterday the symptoms recurred but were more mild.  He felt as though he had pressure retrosternally "like when you have a steak piece of steak stuck in your esophagus."  He feels that the symptoms are worse with exertion and better at rest.  He also has felt sensation of generalized heaviness in his limbs.  He denies nausea, vomiting, diaphoresis.  Patient does not smoke.  He has no personal history of high cholesterol or hypertension and is nondiabetic.  The patient continues to have symptoms of chest heaviness and tightness.  He sought evaluation today.  He has a positive family history of CAD less than 16 years of age and his father.  He denies any recent illnesses including nausea vomiting diarrhea or fevers.  He has not had any recent vaccinations of any kind.  HPI  HPI: A 39 year old patient presents for evaluation of chest pain. Initial onset of pain was more  than 6 hours ago. The patient's chest pain is described as heaviness/pressure/tightness and is not worse with exertion. The patient's chest pain is middle- or left-sided, is not well-localized, is not sharp and does not radiate to the arms/jaw/neck. The patient does not complain of nausea and denies diaphoresis. The patient has a family history of coronary artery disease in a first-degree relative with onset less than age 4. The patient has no history of stroke, has no history of peripheral artery disease, has not smoked in the past 90 days, denies any history of treated diabetes, is not hypertensive, has no history of hypercholesterolemia and does not have an elevated BMI (>=30).   Past Medical History:  Diagnosis Date  . Sinus congestion     Patient Active Problem List   Diagnosis Date Noted  . Indigestion 07/20/2019  . Congestion of left ear 06/09/2019  . Post-nasal drip 06/09/2019  . Nasal congestion 06/09/2019  . Healthcare maintenance 09/16/2018  . Viral upper respiratory tract infection 04/29/2018  . Dysfunction of right eustachian tube 04/29/2018  . Nonallopathic lesion of thoracic region 12/18/2017  . Nonallopathic lesion of cervical region 12/18/2017  . Nonallopathic lesion of lumbosacral region 12/18/2017  . Cervical radiculopathy at C8 12/12/2017  . Scapular dyskinesis 12/12/2017    Past Surgical History:  Procedure Laterality Date  . APPENDECTOMY    . NASAL SINUS SURGERY         No family history on file.  Social  History   Tobacco Use  . Smoking status: Never Smoker  . Smokeless tobacco: Never Used  Substance Use Topics  . Alcohol use: Yes    Comment: liquor most days  . Drug use: Never    Home Medications Prior to Admission medications   Medication Sig Start Date End Date Taking? Authorizing Provider  fluticasone (FLONASE) 50 MCG/ACT nasal spray Place 2 sprays into both nostrils daily. 06/09/19   Mliss Sax, MD  pantoprazole (PROTONIX) 20 MG  tablet Take 1 tablet (20 mg total) by mouth daily. 01/14/20   Arthor Captain, PA-C    Allergies    Patient has no known allergies.  Review of Systems   Review of Systems Ten systems reviewed and are negative for acute change, except as noted in the HPI.   Physical Exam Updated Vital Signs BP (!) 147/89 (BP Location: Right Arm)   Pulse 66   Temp 98 F (36.7 C) (Oral)   Resp 15   Ht 5\' 10"  (1.778 m)   Wt 81.6 kg   SpO2 100%   BMI 25.83 kg/m   Physical Exam Vitals and nursing note reviewed.  Constitutional:      General: He is not in acute distress.    Appearance: He is well-developed. He is not diaphoretic.  HENT:     Head: Normocephalic and atraumatic.  Eyes:     General: No scleral icterus.    Conjunctiva/sclera: Conjunctivae normal.  Cardiovascular:     Rate and Rhythm: Normal rate and regular rhythm.     Heart sounds: Normal heart sounds.  Pulmonary:     Effort: Pulmonary effort is normal. No respiratory distress.     Breath sounds: Normal breath sounds.  Abdominal:     Palpations: Abdomen is soft.     Tenderness: There is no abdominal tenderness.  Musculoskeletal:     Cervical back: Normal range of motion and neck supple.  Skin:    General: Skin is warm and dry.  Neurological:     Mental Status: He is alert.     Deep Tendon Reflexes: Reflexes normal.  Psychiatric:        Behavior: Behavior normal.     ED Results / Procedures / Treatments   Labs (all labs ordered are listed, but only abnormal results are displayed) Labs Reviewed  BASIC METABOLIC PANEL - Abnormal; Notable for the following components:      Result Value   Glucose, Bld 104 (*)    All other components within normal limits  CBC  TROPONIN I (HIGH SENSITIVITY)  TROPONIN I (HIGH SENSITIVITY)    EKG EKG Interpretation  Date/Time:  Thursday January 14 2020 13:38:28 EDT Ventricular Rate:  78 PR Interval:    QRS Duration: 99 QT Interval:  366 QTC Calculation: 417 R Axis:   63 Text  Interpretation: Sinus rhythm RSR' in V1 or V2, probably normal variant No old tracing to compare Confirmed by [id number] [phone]) on 01/14/2020 1:46:30 PM Also confirmed by 01/16/2020 [phone]), editor [employer], LaVerne ([place])  on 01/15/2020 10:36:48 AM   Radiology DG Chest Port 1 View  Result Date: 01/14/2020 CLINICAL DATA:  Chest pain EXAM: PORTABLE CHEST 1 VIEW COMPARISON:  None. FINDINGS: The heart size and mediastinal contours are within normal limits. Both lungs are clear. The visualized skeletal structures are unremarkable. IMPRESSION: No active disease. Electronically Signed   By: 01/16/2020 D.O.   On: 01/14/2020 14:56    Procedures Procedures (including critical care time)  Medications Ordered  in ED Medications  alum & mag hydroxide-simeth (MAALOX/MYLANTA) 200-200-20 MG/5ML suspension 30 mL (30 mLs Oral Given 01/14/20 1527)    And  lidocaine (XYLOCAINE) 2 % viscous mouth solution 15 mL (15 mLs Oral Given 01/14/20 1527)    ED Course  I have reviewed the triage vital signs and the nursing notes.  Pertinent labs & imaging results that were available during my care of the patient were reviewed by me and considered in my medical decision making (see chart for details).    MDM Rules/Calculators/A&P HEAR Score: 2                        CC: Chest pain VS:  Vitals:   01/14/20 1341 01/14/20 1342 01/14/20 1458  BP: (!) 141/90  (!) 147/89  Pulse: 79  66  Resp: 20  15  Temp: 98 F (36.7 C)    TempSrc: Oral    SpO2: 100%  100%  Weight:  81.6 kg   Height:  5\' 10"  (1.778 m)     is gathered by patient and EMR. Previous records obtained and reviewed. DDX:The patient's complaint of chest pain and weakness involves an extensive number of diagnostic and treatment options, and is a complaint that carries with it a high risk of complications, morbidity, and potential mortality. Given the large differential diagnosis, medical decision making is of high complexity. The  emergent differential diagnosis of chest pain includes: Acute coronary syndrome, pericarditis, aortic dissection, pulmonary embolism, tension pneumothorax, pneumonia, and esophageal rupture.   Labs: I ordered reviewed and interpreted labs which includ BMP which shows glucose of 104 and that is of insignificant value, CBC within normal limits, troponin at a normal value Imaging: I ordered and reviewed images which included 1 view chest x-ray. I independently visualized and interpreted all imaging. There are no acute, significant findings on today's images. EKG: Normal sinus rhythm at a rate of 78 Consults: N/A MDM: Patient here with complaint of chest pain likely digestive in nature. His work-up is without significant abnormality. Heart score is 2 with a negative troponin and normal EKG and not indicative of ACS as cause. Patient has normal reflexes, strength and sensation and I have no suspicion for underlying neurologic process such as Guillain-Barr or myasthenia gravis. Patient will be discharged to begin PPI regularly, outpatient follow-up with gastroenterology. Discussed return precautions. Appears appropriate for discharge at this time Patient disposition: Discharge The patient appears reasonably screened and/or stabilized for discharge and I doubt any other medical condition or other [facility] requiring further screening, evaluation, or treatment in the ED at this time prior to discharge. I have discussed lab and/or imaging findings with the patient and answered all questions/concerns to the best of my ability.I have discussed return precautions and OP follow up.    Final Clinical Impression(s) / ED Diagnoses Final diagnoses:  Atypical chest pain  Heartburn    Rx / DC Orders ED Discharge Orders         Ordered    pantoprazole (PROTONIX) 20 MG tablet  Daily     Discontinue  Reprint     01/14/20 1544           01/16/20, PA-C 01/15/20 1303    01/17/20, DO 01/15/20 1406

## 2020-01-14 NOTE — ED Triage Notes (Signed)
Pt sts his chest feels like he has not completely swallowed a piece of steak.... or like someone is pushing on his chest.  Started 2 days ago and is getting worse.  Feels tired and heavy.  Arms get tingly. Legs feel heavy.

## 2020-01-14 NOTE — ED Notes (Signed)
ED Provider at bedside. 

## 2020-01-14 NOTE — ED Notes (Signed)
Done by another tech in triage 

## 2020-01-14 NOTE — Discharge Instructions (Addendum)
Your work up was negative for emergent cause of your chest pain and your symptoms are likely related to your digestive tract.  I would suggest highly making an appointment with a gastroenterologist soon as possible your symptoms and you may need an upper endoscopy.  In the meantime like you to take a acid reducing medication daily for the next month.  Please read the attached information about dietary modifications.  Try to follow these as much as possible.  Your caregiver has diagnosed you as having chest pain that is not specific for one problem, but does not require admission.  You are at low risk for an acute heart condition or other serious illness. Chest pain comes from many different causes.  SEEK IMMEDIATE MEDICAL ATTENTION IF: You have severe chest pain, especially if the pain is crushing or pressure-like and spreads to the arms, back, neck, or jaw, or if you have sweating, nausea (feeling sick to your stomach), or shortness of breath. THIS IS AN EMERGENCY. Don't wait to see if the pain will go away. Get medical help at once. Call 911 or 0 (operator). DO NOT drive yourself to the hospital.  Your chest pain gets worse and does not go away with rest.  You have an attack of chest pain lasting longer than usual, despite rest and treatment with the medications your caregiver has prescribed.  You wake from sleep with chest pain or shortness of breath.  You feel dizzy or faint.  You have chest pain not typical of your usual pain for which you originally saw your caregiver.

## 2020-01-19 DIAGNOSIS — L820 Inflamed seborrheic keratosis: Secondary | ICD-10-CM | POA: Diagnosis not present

## 2020-01-19 DIAGNOSIS — L218 Other seborrheic dermatitis: Secondary | ICD-10-CM | POA: Diagnosis not present

## 2020-12-14 NOTE — Progress Notes (Signed)
[facility] [address] [place] [address] Phone: [phone] Subjective:   Wayne [name], am serving as a scribe for Dr. Antoine Primas. This visit occurred during the SARS-CoV-2 public health emergency.  Safety protocols were in place, including screening questions prior to the visit, additional usage of staff PPE, and extensive cleaning of exam room while observing appropriate contact time as indicated for disinfecting solutions.   I'm seeing this patient by the request  of:  Mliss Sax, MD  CC: Low back pain  [id number]  Wayne [name] is a 40 y.o. male coming in with complaint of back and neck pain. December 2019. Patient states that his pain is worse with sitting. Pain lower L side. Has hard time getting up from seated position. Gets relief with towel behind back.  Does not remember any true injury.  Patient states it seems to come and go.  Worse after activity with tightness.  Medications patient has been prescribed: None Taking:         Reviewed prior external information including notes and imaging from previsou exam, outside providers and external EMR if available.   As well as notes that were available from care everywhere and other healthcare systems.  Past medical history, social, surgical and family history all reviewed in electronic medical record.  No pertanent information unless stated regarding to the chief complaint.   Past Medical History:  Diagnosis Date  . Sinus congestion     No Known Allergies   Review of Systems:  No headache, visual changes, nausea, vomiting, diarrhea, constipation, dizziness, abdominal pain, skin rash, fevers, chills, night sweats, weight loss, swollen lymph nodes, body aches, joint swelling, chest pain, shortness of breath, mood changes. POSITIVE muscle aches mild  Objective  Blood pressure 138/86, pulse 83, height 5\' 10"  (1.778 m), weight 202 lb (91.6 kg), SpO2 99 %.   General:  No apparent distress alert and oriented x3 mood and affect normal, dressed appropriately.  HEENT: Pupils equal, extraocular movements intact  Respiratory: Patient's speak in full sentences and does not appear short of breath  Cardiovascular: No lower extremity edema, non tender, no erythema  Low back exam does have some mild loss of lordosis.  Tightness noted in the paraspinal musculature on the left side of the spine.  Tenderness over the left sacroiliac joint.  Tightness with FABER test on the left greater than right.  Negative straight leg test.  Osteopathic findings  C2 flexed rotated and side bent right T9 extended rotated and side bent left L2 flexed rotated and side bent right Sacrum left on left       Assessment and Plan:  Piriformis syndrome, left Left-sided piriformis.  Discussed with patient about icing regimen and home exercises, we discussed avoiding certain activities.  Discussed tennis ball and other manual massage techniques.  We will get x-rays to rule out any type of lumbar radiculopathy that Could be contributing.  Patient declined any type of medications.  Follow-up again in 5 weeks    Nonallopathic problems  Decision today to treat with OMT was based on Physical Exam  After verbal consent patient was treated with HVLA, ME, FPR techniques in cervical,  thoracic, lumbar, and sacral  areas  Patient tolerated the procedure well with improvement in symptoms  Patient given exercises, stretches and lifestyle modifications  See medications in patient instructions if given  Patient will follow up in 4-8 weeks      The above documentation has been reviewed  and is accurate and complete Wayne Pulley, DO       Note: This dictation was prepared with Dragon dictation along with smaller phrase technology. Any transcriptional errors that result from this process are unintentional.

## 2020-12-15 ENCOUNTER — Encounter: Payer: Self-pay | Admitting: Family Medicine

## 2020-12-15 ENCOUNTER — Ambulatory Visit: Payer: BC Managed Care – PPO | Admitting: Family Medicine

## 2020-12-15 ENCOUNTER — Other Ambulatory Visit: Payer: Self-pay

## 2020-12-15 VITALS — BP 138/86 | HR 83 | Ht 70.0 in | Wt 202.0 lb

## 2020-12-15 DIAGNOSIS — M9902 Segmental and somatic dysfunction of thoracic region: Secondary | ICD-10-CM | POA: Diagnosis not present

## 2020-12-15 DIAGNOSIS — M9904 Segmental and somatic dysfunction of sacral region: Secondary | ICD-10-CM

## 2020-12-15 DIAGNOSIS — M9901 Segmental and somatic dysfunction of cervical region: Secondary | ICD-10-CM

## 2020-12-15 DIAGNOSIS — M545 Low back pain, unspecified: Secondary | ICD-10-CM | POA: Diagnosis not present

## 2020-12-15 DIAGNOSIS — G5702 Lesion of sciatic nerve, left lower limb: Secondary | ICD-10-CM | POA: Insufficient documentation

## 2020-12-15 DIAGNOSIS — M9903 Segmental and somatic dysfunction of lumbar region: Secondary | ICD-10-CM

## 2020-12-15 NOTE — Patient Instructions (Addendum)
Xray today Exercises 3 times a week  Tennis ball in back left pocket with sitting or driving long amount of time Ice is still a [employer] idea at the end of a long day  See me again in 5 weeks

## 2020-12-15 NOTE — Assessment & Plan Note (Signed)
Left-sided piriformis.  Discussed with patient about icing regimen and home exercises, we discussed avoiding certain activities.  Discussed tennis ball and other manual massage techniques.  We will get x-rays to rule out any type of lumbar radiculopathy that Could be contributing.  Patient declined any type of medications.  Follow-up again in 5 weeks

## 2021-01-16 NOTE — Progress Notes (Signed)
[facility] [address] [place] [address] Phone: [phone] Subjective:   Wayne [name], am serving as a scribe for Dr. Antoine Primas.  I'm seeing this patient by the request  of:  Mliss Sax, MD  CC: Low back pain follow-up  [id number]  Wayne [name] is a 40 y.o. male coming in with complaint of back and neck pain. OMT 12/15/2020. 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 Patient states that he was wearing his old shoes because he wasn't wanting to break in his new shoes yet, but has since started wearing them and with the better support his back has seemed to get a bit better.  Patient has some difficulty with sleep.  Feels like he continues to have significant fatigue.  Patient has had nasal polyps previously and is wondering if that is what is coming back or potentially he is snoring.  States that his wife has noticed symptoms snoring is a possibility.  Medications patient has been prescribed: None         Reviewed prior external information including notes and imaging from previsou exam, outside providers and external EMR if available.   As well as notes that were available from care everywhere and other healthcare systems.  Past medical history, social, surgical and family history all reviewed in electronic medical record.  No pertanent information unless stated regarding to the chief complaint.   Past Medical History:  Diagnosis Date   Sinus congestion     No Known Allergies   Review of Systems:  No headache, visual changes, nausea, vomiting, diarrhea, constipation, dizziness, abdominal pain, skin rash, fevers, chills, night sweats, weight loss, swollen lymph nodes, body aches, joint swelling, chest pain, shortness of breath, mood changes. POSITIVE muscle aches, fatigue  Objective  Blood pressure 130/86, pulse (!) 57, height 5\' 10"   (1.778 m), weight 197 lb (89.4 kg), SpO2 98 %.   General: No apparent distress alert and oriented x3 mood and affect normal, dressed appropriately.  HEENT: Pupils equal, extraocular movements intact  Respiratory: Patient's speak in full sentences and does not appear short of breath  Cardiovascular: No lower extremity edema, non tender, no erythema  Gait normal with Tiley balance and coordination.  MSK:  Non tender with full range of motion and Theil stability and symmetric strength and tone of shoulders, elbows, wrist, hip, knee and ankles bilaterally.  Back -low back exam shows the patient does have some tightness still noted in the paraspinal musculature of the lumbar spine left greater than right.  Tenderness in the left sacroiliac joint.  Tightness noted in the right parascapular region.  Mild pain with sidebending of the neck but nothing severe with near full range of motion.  Osteopathic findings  C2 flexed rotated and side bent right T5 extended rotated and side bent right  L2 flexed rotated and side bent right Sacrum left on left       Assessment and Plan:  Piriformis syndrome, left Patient is already making some improvement.  Discussed with patient icing regimen and home exercises.  Responded extremely well to osteopathic manipulation.  Patient does have some dysfunction Noted and he states that sleep.  We will get a sleep study to further evaluate.  Has had a history of nasal polyps and encouraged him to follow-up with ENT as well.  Follow-up with me again in 6 weeks otherwise  Sleep  disorder Patient is having difficulty with sleep.  Will order sleep apnea evaluation with sleep study.  Has been further evaluated at that time.  Has had a history of nasal polyps previously.   Nonallopathic problems  Decision today to treat with OMT was based on Physical Exam  After verbal consent patient was treated with HVLA, ME, FPR techniques in cervical, thoracic, lumbar, and sacral   areas  Patient tolerated the procedure well with improvement in symptoms  Patient given exercises, stretches and lifestyle modifications  See medications in patient instructions if given  Patient will follow up in 4-8 weeks      The above documentation has been reviewed and is accurate and complete Judi Saa, DO       Note: This dictation was prepared with Dragon dictation along with smaller phrase technology. Any transcriptional errors that result from this process are unintentional.

## 2021-01-17 ENCOUNTER — Encounter: Payer: Self-pay | Admitting: Family Medicine

## 2021-01-17 ENCOUNTER — Ambulatory Visit (INDEPENDENT_AMBULATORY_CARE_PROVIDER_SITE_OTHER): Payer: BC Managed Care – PPO | Admitting: Family Medicine

## 2021-01-17 ENCOUNTER — Other Ambulatory Visit: Payer: Self-pay

## 2021-01-17 VITALS — BP 130/86 | HR 57 | Ht 70.0 in | Wt 197.0 lb

## 2021-01-17 DIAGNOSIS — G5702 Lesion of sciatic nerve, left lower limb: Secondary | ICD-10-CM | POA: Diagnosis not present

## 2021-01-17 DIAGNOSIS — M9903 Segmental and somatic dysfunction of lumbar region: Secondary | ICD-10-CM | POA: Diagnosis not present

## 2021-01-17 DIAGNOSIS — G479 Sleep disorder, unspecified: Secondary | ICD-10-CM | POA: Insufficient documentation

## 2021-01-17 DIAGNOSIS — M9902 Segmental and somatic dysfunction of thoracic region: Secondary | ICD-10-CM | POA: Diagnosis not present

## 2021-01-17 DIAGNOSIS — M9901 Segmental and somatic dysfunction of cervical region: Secondary | ICD-10-CM | POA: Diagnosis not present

## 2021-01-17 NOTE — Assessment & Plan Note (Signed)
Patient is already making some improvement.  Discussed with patient icing regimen and home exercises.  Responded extremely well to osteopathic manipulation.  Patient does have some dysfunction Noted and he states that sleep.  We will get a sleep study to further evaluate.  Has had a history of nasal polyps and encouraged him to follow-up with ENT as well.  Follow-up with me again in 6 weeks otherwise

## 2021-01-17 NOTE — Patient Instructions (Addendum)
Ritzel to see you  Don't need to do sleep study right away Stay active Try vitamins in July See me again in 5-6 weeks

## 2021-01-17 NOTE — Assessment & Plan Note (Signed)
Patient is having difficulty with sleep.  Will order sleep apnea evaluation with sleep study.  Has been further evaluated at that time.  Has had a history of nasal polyps previously.

## 2021-01-29 IMAGING — DX DG CHEST 1V PORT
1 series · 1 of 1 positions shown · non-contrast
Comparison: None.

CLINICAL DATA: Chest pain

EXAM:
PORTABLE CHEST 1 VIEW

[chest ap]
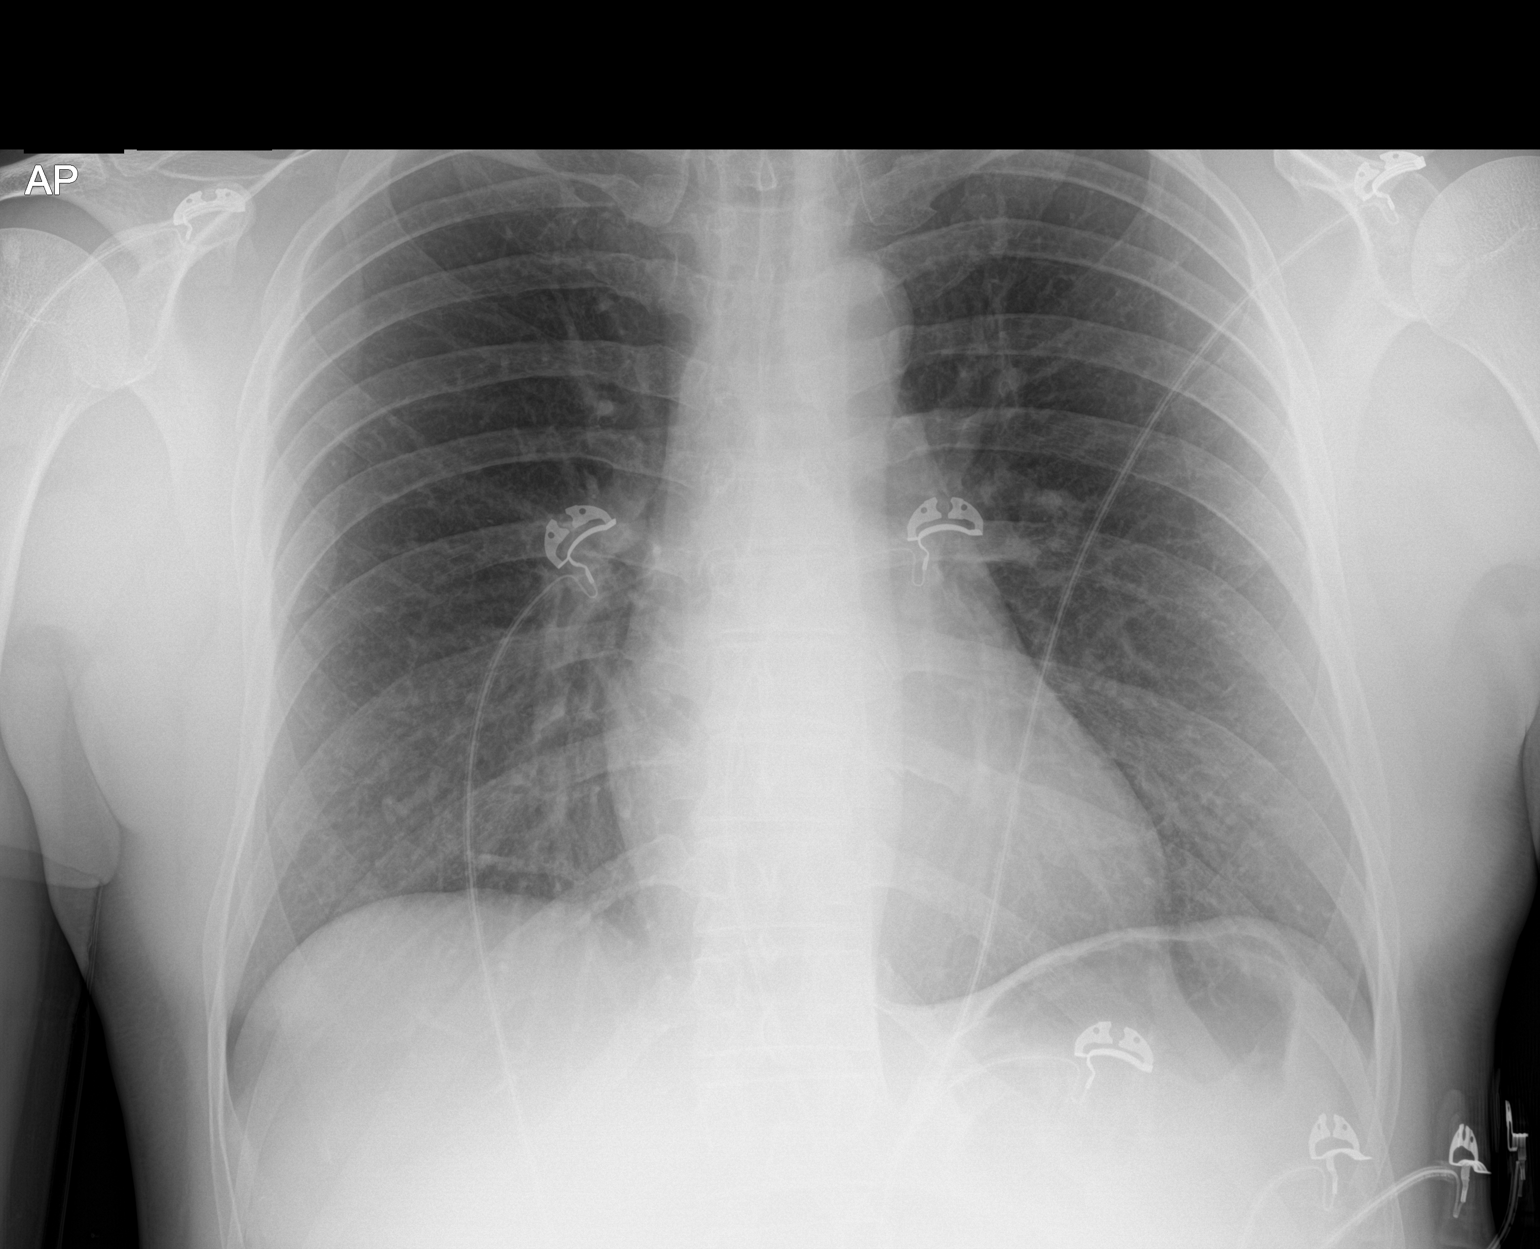

[1 of 1 positions shown; findings below may reference images not displayed]

FINDINGS: The heart size and mediastinal contours are within normal limits.
Both lungs are clear. The visualized skeletal structures are
unremarkable.
IMPRESSION: No active disease.

## 2021-02-27 NOTE — Progress Notes (Signed)
  Wayne [name] [address] [place] [address] Phone: [phone] Subjective:   Wayne [name], am serving as a scribe for Dr. Antoine Primas.  This visit occurred during the SARS-CoV-2 public health emergency.  Safety protocols were in place, including screening questions prior to the visit, additional usage of staff PPE, and extensive cleaning of exam room while observing appropriate contact time as indicated for disinfecting solutions.   I'm seeing this patient by the request  of:  Wayne Sax, MD  CC: Neck and back pain follow-up  [id number]  Wayne [name] is a 40 y.o. male coming in with complaint of back and neck pain. OMT 01/14/2021. Patient states that he has been doing well until past few days. Had company work party and stood in less supportive shoes which caused an increase in back pain.   Medications patient has been prescribed: None         Past Medical History:  Diagnosis Date   Sinus congestion     No Known Allergies   Review of Systems:  No headache, visual changes, nausea, vomiting, diarrhea, constipation, dizziness, abdominal pain, skin rash, fevers, chills, night sweats, weight loss, swollen lymph nodes, body aches, joint swelling, chest pain, shortness of breath, mood changes. POSITIVE muscle aches  Objective  Blood pressure (!) 122/92, pulse 68, height 5\' 10"  (1.778 m), weight 193 lb (87.5 kg), SpO2 99 %.   General: No apparent distress alert and oriented x3 mood and affect normal, dressed appropriately.  HEENT: Pupils equal, extraocular movements intact  Respiratory: Patient's speak in full sentences and does not appear short of breath  Cardiovascular: No lower extremity edema, non tender, no erythema  Neck pain shows the patient does have very mild loss of lordosis.  He still has scapular dyskinesis noted.  Patient overall still an improvement from previous first exam multiple months ago.  Negative  Spurling's   Osteopathic findings  C2 flexed rotated and side bent right C6 flexed rotated and side bent left T9 extended rotated and side bent left L2 flexed rotated and side bent right        Assessment and Plan:  Scapular dyskinesis Patient still working on posture.  Some mild discomfort noted of the night.  Patient was doing very well with osteopathic regulation.  Discussed which activities to do which wants to avoid.  Increase activity slowly.  Discussed icing regimen and home exercises.  Discussed avoiding certain activities.  Patient will continue with the conservative therapy and follow-up with me again in 6 to 8 weeks   Nonallopathic problems  Decision today to treat with OMT was based on Physical Exam  After verbal consent patient was treated with HVLA, ME, FPR techniques in cervical, thoracic, lumbar areas  Patient tolerated the procedure well with improvement in symptoms  Patient given exercises, stretches and lifestyle modifications  See medications in patient instructions if given  Patient will follow up in 4-8 weeks      The above documentation has been reviewed and is accurate and complete , DO        Note: This dictation was prepared with Dragon dictation along with smaller phrase technology. Any transcriptional errors that result from this process are unintentional.

## 2021-02-28 ENCOUNTER — Ambulatory Visit (INDEPENDENT_AMBULATORY_CARE_PROVIDER_SITE_OTHER): Payer: BC Managed Care – PPO | Admitting: Family Medicine

## 2021-02-28 ENCOUNTER — Other Ambulatory Visit: Payer: Self-pay

## 2021-02-28 ENCOUNTER — Encounter: Payer: Self-pay | Admitting: Family Medicine

## 2021-02-28 VITALS — BP 122/92 | HR 68 | Ht 70.0 in | Wt 193.0 lb

## 2021-02-28 DIAGNOSIS — G2589 Other specified extrapyramidal and movement disorders: Secondary | ICD-10-CM

## 2021-02-28 DIAGNOSIS — M9903 Segmental and somatic dysfunction of lumbar region: Secondary | ICD-10-CM | POA: Diagnosis not present

## 2021-02-28 DIAGNOSIS — M9901 Segmental and somatic dysfunction of cervical region: Secondary | ICD-10-CM | POA: Diagnosis not present

## 2021-02-28 DIAGNOSIS — M9902 Segmental and somatic dysfunction of thoracic region: Secondary | ICD-10-CM | POA: Diagnosis not present

## 2021-02-28 NOTE — Patient Instructions (Signed)
[place] to see you  Thank you for the birthday wishes  Do not make any big changes  See me again in 6-8 weeks

## 2021-02-28 NOTE — Assessment & Plan Note (Signed)
Patient still working on posture.  Some mild discomfort noted of the night.  Patient was doing very well with osteopathic regulation.  Discussed which activities to do which wants to avoid.  Increase activity slowly.  Discussed icing regimen and home exercises.  Discussed avoiding certain activities.  Patient will continue with the conservative therapy and follow-up with me again in 6 to 8 weeks

## 2021-04-11 NOTE — Progress Notes (Signed)
  [facility] [address] [place] [address] Phone: [phone] Subjective:   INadine Counts, am serving as a scribe for Dr. Antoine Primas. This visit occurred during the SARS-CoV-2 public health emergency.  Safety protocols were in place, including screening questions prior to the visit, additional usage of staff PPE, and extensive cleaning of exam room while observing appropriate contact time as indicated for disinfecting solutions.    This visit occurred during the SARS-CoV-2 public health emergency.  Safety protocols were in place, including screening questions prior to the visit, additional usage of staff PPE, and extensive cleaning of exam room while observing appropriate contact time as indicated for disinfecting solutions.   I'm seeing this patient by the request  of:  Mliss Sax, MD  CC: Neck pain and back pain  [id number]  Wayne [name] is a 40 y.o. male coming in with complaint of back and neck pain. OMT 02/28/2021. Patient states his pain is the same. No new complaints.  Medications patient has been prescribed: None           Past Medical History:  Diagnosis Date   Sinus congestion     No Known Allergies   Review of Systems:  No headache, visual changes, nausea, vomiting, diarrhea, constipation, dizziness, abdominal pain, skin rash, fevers, chills, night sweats, weight loss, swollen lymph nodes, body aches, joint swelling, chest pain, shortness of breath, mood changes. POSITIVE muscle aches  Objective  Blood pressure (!) 124/96, pulse 63, height 5\' 10"  (1.778 m), weight 198 lb (89.8 kg), SpO2 97 %.   General: No apparent distress alert and oriented x3 mood and affect normal, dressed appropriately.  HEENT: Pupils equal, extraocular movements intact  Respiratory: Patient's speak in full sentences and does not appear short of breath  Cardiovascular: No lower extremity edema, non tender, no erythema  Neck pain  assessment loss of lordosis.  Some tenderness to palpation of the paraspinal musculature.  Tightness noted in the parascapular region right greater than left.  Osteopathic findings  C2 flexed rotated and side bent right T3 extended rotated and side bent right T9 extended rotated and side bent left L2 flexed rotated and side bent right Sacrum right on right       Assessment and Plan:  Scapular dyskinesis Chronic problem and finally making some progress.  Discussed icing regimen and home exercises, increase activity slowly.  Discussed icing regimen and home exercises.  Follow-up with me again in 2 months   Nonallopathic problems  Decision today to treat with OMT was based on Physical Exam  After verbal consent patient was treated with HVLA, ME, FPR techniques in cervical,  thoracic, lumbar, and sacral  areas  Patient tolerated the procedure well with improvement in symptoms  Patient given exercises, stretches and lifestyle modifications  See medications in patient instructions if given  Patient will follow up in 4-8 weeks      The above documentation has been reviewed and is accurate and complete , DO       Note: This dictation was prepared with Dragon dictation along with smaller phrase technology. Any transcriptional errors that result from this process are unintentional.

## 2021-04-12 ENCOUNTER — Encounter: Payer: Self-pay | Admitting: Family Medicine

## 2021-04-12 ENCOUNTER — Other Ambulatory Visit: Payer: Self-pay

## 2021-04-12 ENCOUNTER — Ambulatory Visit: Payer: BC Managed Care – PPO | Admitting: Family Medicine

## 2021-04-12 VITALS — BP 124/96 | HR 63 | Ht 70.0 in | Wt 198.0 lb

## 2021-04-12 DIAGNOSIS — M9903 Segmental and somatic dysfunction of lumbar region: Secondary | ICD-10-CM | POA: Diagnosis not present

## 2021-04-12 DIAGNOSIS — M9901 Segmental and somatic dysfunction of cervical region: Secondary | ICD-10-CM | POA: Diagnosis not present

## 2021-04-12 DIAGNOSIS — G2589 Other specified extrapyramidal and movement disorders: Secondary | ICD-10-CM | POA: Diagnosis not present

## 2021-04-12 DIAGNOSIS — M9902 Segmental and somatic dysfunction of thoracic region: Secondary | ICD-10-CM

## 2021-04-12 NOTE — Patient Instructions (Signed)
Slow down on pumpkin spice Keep doing everything else See you again in 6-8 weeks

## 2021-04-12 NOTE — Assessment & Plan Note (Signed)
Chronic problem and finally making some progress.  Discussed icing regimen and home exercises, increase activity slowly.  Discussed icing regimen and home exercises.  Follow-up with me again in 2 months

## 2021-06-12 ENCOUNTER — Ambulatory Visit: Payer: BC Managed Care – PPO | Admitting: Family Medicine

## 2021-07-11 NOTE — Progress Notes (Signed)
°  [facility] [address] [place] [address] Phone: [phone] Subjective:   Bruce Donath, am serving as a scribe for Dr. Antoine Primas.  This visit occurred during the SARS-CoV-2 public health emergency.  Safety protocols were in place, including screening questions prior to the visit, additional usage of staff PPE, and extensive cleaning of exam room while observing appropriate contact time as indicated for disinfecting solutions.   I'm seeing this patient by the request  of:  Mliss Sax, MD  CC: Neck pain  [id number]  Wayne [name] is a 40 y.o. male coming in with complaint of back and neck pain. OMT 04/12/2021. Patient states that he has been doing well since last visit. No new complaints.   Medications patient has been prescribed: None          Reviewed prior external information including notes and imaging from previsou exam, outside providers and external EMR if available.   As well as notes that were available from care everywhere and other healthcare systems.  Past medical history, social, surgical and family history all reviewed in electronic medical record.  No pertanent information unless stated regarding to the chief complaint.   Past Medical History:  Diagnosis Date   Sinus congestion     No Known Allergies   Review of Systems:  No headache, visual changes, nausea, vomiting, diarrhea, constipation, dizziness, abdominal pain, skin rash, fevers, chills, night sweats, weight loss, swollen lymph nodes, body aches, joint swelling, chest pain, shortness of breath, mood changes. POSITIVE muscle aches  Objective  Blood pressure (!) 124/92, pulse 72, height 5\' 10"  (1.778 m), weight 203 lb (92.1 kg), SpO2 99 %.   General: No apparent distress alert and oriented x3 mood and affect normal, dressed appropriately.  HEENT: Pupils equal, extraocular movements intact  Respiratory: Patient's speak in full sentences and does  not appear short of breath  Cardiovascular: No lower extremity edema, non tender, no erythema  Neck exam very mild loss of lordosis.  Some very mild tightness on the left greater than the right.  Osteopathic findings   C6 flexed rotated and side bent left T3 extended rotated and side bent right inhaled rib T9 extended rotated and side bent left        Assessment and Plan: Cervical radiculopathy at C8 Patient is doing very well.  Stable overall.  No significant radicular symptoms.  Patient has done well and will follow-up with me again in 3 to 4 months.  Scapular dyskinesis Stable at the moment.  No significant back pain either.  Doing very well and can follow-up with me again in 3 to 4 months    Nonallopathic problems  Decision today to treat with OMT was based on Physical Exam  After verbal consent patient was treated with HVLA, ME, FPR techniques in cervical, rib, thoracic areas  Patient tolerated the procedure well with improvement in symptoms  Patient given exercises, stretches and lifestyle modifications  See medications in patient instructions if given  Patient will follow up in 4-8 weeks      The above documentation has been reviewed and is accurate and complete , DO        Note: This dictation was prepared with Dragon dictation along with smaller phrase technology. Any transcriptional errors that result from this process are unintentional.

## 2021-07-12 ENCOUNTER — Encounter: Payer: Self-pay | Admitting: Family Medicine

## 2021-07-12 ENCOUNTER — Other Ambulatory Visit: Payer: Self-pay

## 2021-07-12 ENCOUNTER — Ambulatory Visit (INDEPENDENT_AMBULATORY_CARE_PROVIDER_SITE_OTHER): Payer: BC Managed Care – PPO | Admitting: Family Medicine

## 2021-07-12 VITALS — BP 124/92 | HR 72 | Ht 70.0 in | Wt 203.0 lb

## 2021-07-12 DIAGNOSIS — M9901 Segmental and somatic dysfunction of cervical region: Secondary | ICD-10-CM

## 2021-07-12 DIAGNOSIS — G2589 Other specified extrapyramidal and movement disorders: Secondary | ICD-10-CM

## 2021-07-12 DIAGNOSIS — M9902 Segmental and somatic dysfunction of thoracic region: Secondary | ICD-10-CM

## 2021-07-12 DIAGNOSIS — M9908 Segmental and somatic dysfunction of rib cage: Secondary | ICD-10-CM | POA: Diagnosis not present

## 2021-07-12 DIAGNOSIS — M5412 Radiculopathy, cervical region: Secondary | ICD-10-CM

## 2021-07-12 NOTE — Patient Instructions (Signed)
Say to [facility]  See me again in 3-4 months

## 2021-07-12 NOTE — Assessment & Plan Note (Signed)
Patient is doing very well.  Stable overall.  No significant radicular symptoms.  Patient has done well and will follow-up with me again in 3 to 4 months.

## 2021-07-12 NOTE — Assessment & Plan Note (Signed)
Stable at the moment.  No significant back pain either.  Doing very well and can follow-up with me again in 3 to 4 months

## 2021-09-29 NOTE — Progress Notes (Unsigned)
°  [facility] [address] [place] [address] Phone: [phone] Subjective:    I'm seeing this patient by the request  of:  Mliss Sax, MD  CC:   [id number]  Jeffren Dombek is a 41 y.o. male coming in with complaint of back and neck pain. OMT 07/12/2021.Patient states   Medications patient has been prescribed: None  Taking:         Reviewed prior external information including notes and imaging from previsou exam, outside providers and external EMR if available.   As well as notes that were available from care everywhere and other healthcare systems.  Past medical history, social, surgical and family history all reviewed in electronic medical record.  No pertanent information unless stated regarding to the chief complaint.   Past Medical History:  Diagnosis Date   Sinus congestion     No Known Allergies   Review of Systems:  No headache, visual changes, nausea, vomiting, diarrhea, constipation, dizziness, abdominal pain, skin rash, fevers, chills, night sweats, weight loss, swollen lymph nodes, body aches, joint swelling, chest pain, shortness of breath, mood changes. POSITIVE muscle aches  Objective  There were no vitals taken for this visit.   General: No apparent distress alert and oriented x3 mood and affect normal, dressed appropriately.  HEENT: Pupils equal, extraocular movements intact  Respiratory: Patient's speak in full sentences and does not appear short of breath  Cardiovascular: No lower extremity edema, non tender, no erythema  Neuro: Cranial nerves II through XII are intact, neurovascularly intact in all extremities with 2+ DTRs and 2+ pulses.  Gait normal with Pucillo balance and coordination.  MSK:  Non tender with full range of motion and Faron stability and symmetric strength and tone of shoulders, elbows, wrist, hip, knee and ankles bilaterally.  Back - Normal skin, Spine with normal alignment and no  deformity.  No tenderness to vertebral process palpation.  Paraspinous muscles are not tender and without spasm.   Range of motion is full at neck and lumbar sacral regions  Osteopathic findings  C2 flexed rotated and side bent right C6 flexed rotated and side bent left T3 extended rotated and side bent right inhaled rib T9 extended rotated and side bent left L2 flexed rotated and side bent right Sacrum right on right       Assessment and Plan:    Nonallopathic problems  Decision today to treat with OMT was based on Physical Exam  After verbal consent patient was treated with HVLA, ME, FPR techniques in cervical, rib, thoracic, lumbar, and sacral  areas  Patient tolerated the procedure well with improvement in symptoms  Patient given exercises, stretches and lifestyle modifications  See medications in patient instructions if given  Patient will follow up in 4-8 weeks      The above documentation has been reviewed and is accurate and complete Wilford Grist       Note: This dictation was prepared with Dragon dictation along with smaller phrase technology. Any transcriptional errors that result from this process are unintentional.

## 2021-10-03 ENCOUNTER — Other Ambulatory Visit: Payer: Self-pay

## 2021-10-03 ENCOUNTER — Ambulatory Visit: Payer: BC Managed Care – PPO | Admitting: Family Medicine

## 2021-10-03 VITALS — BP 130/80 | HR 70 | Ht 70.0 in | Wt 203.0 lb

## 2021-10-03 DIAGNOSIS — M9904 Segmental and somatic dysfunction of sacral region: Secondary | ICD-10-CM

## 2021-10-03 DIAGNOSIS — M5412 Radiculopathy, cervical region: Secondary | ICD-10-CM

## 2021-10-03 DIAGNOSIS — M9903 Segmental and somatic dysfunction of lumbar region: Secondary | ICD-10-CM

## 2021-10-03 DIAGNOSIS — M9902 Segmental and somatic dysfunction of thoracic region: Secondary | ICD-10-CM

## 2021-10-03 DIAGNOSIS — M9908 Segmental and somatic dysfunction of rib cage: Secondary | ICD-10-CM

## 2021-10-03 DIAGNOSIS — M9901 Segmental and somatic dysfunction of cervical region: Secondary | ICD-10-CM

## 2021-10-03 NOTE — Assessment & Plan Note (Signed)
Patient does have some loss of lordosis.  Discussed icing regimen and home exercises, discussed which activities to do which wants to avoid.  Increase activity slowly.  Follow-up again in  8 weeks. ?

## 2021-10-03 NOTE — Patient Instructions (Addendum)
[place] to see you  ?Tell your wonderful wife hahahahaha ?COOP pillow  ?See me again in 2 months just in case  ? ?

## 2021-11-01 ENCOUNTER — Encounter: Payer: Self-pay | Admitting: Family Medicine

## 2021-11-01 ENCOUNTER — Ambulatory Visit: Payer: BC Managed Care – PPO | Admitting: Family Medicine

## 2021-11-01 VITALS — BP 141/79 | HR 73 | Ht 70.28 in | Wt 204.7 lb

## 2021-11-01 DIAGNOSIS — R5383 Other fatigue: Secondary | ICD-10-CM | POA: Diagnosis not present

## 2021-11-01 DIAGNOSIS — Z13220 Encounter for screening for lipoid disorders: Secondary | ICD-10-CM | POA: Diagnosis not present

## 2021-11-01 DIAGNOSIS — Z0000 Encounter for general adult medical examination without abnormal findings: Secondary | ICD-10-CM | POA: Diagnosis not present

## 2021-11-01 DIAGNOSIS — E559 Vitamin D deficiency, unspecified: Secondary | ICD-10-CM | POA: Diagnosis not present

## 2021-11-01 DIAGNOSIS — R0981 Nasal congestion: Secondary | ICD-10-CM

## 2021-11-01 DIAGNOSIS — J310 Chronic rhinitis: Secondary | ICD-10-CM

## 2021-11-01 NOTE — Patient Instructions (Addendum)
Great to meet you today! ?We'll be in touch with lab results.  If testosterone levels are low we'll need an early morning level for confirmation.   ?You should be contacted by ENT to set up an appointment.  ?

## 2021-11-01 NOTE — Assessment & Plan Note (Signed)
He has chronic nasal congestion rhinitis.  History of previous sinus surgery however feels that symptoms are worsening.  Feels like this may be affecting his sleep.  Referral placed to ENT.  We did discuss that if ENT evaluation did not show any significant concerns I would recommend that we proceed with sleep study. ?

## 2021-11-01 NOTE — Assessment & Plan Note (Signed)
Well adult ?Orders Placed This Encounter  ?Procedures  ?? COMPLETE METABOLIC PANEL WITH GFR  ?? CBC with Differential  ?? Lipid Panel w/reflex Direct LDL  ?? TSH  ?? B12  ?? Testosterone  ?? Vitamin D (25 hydroxy)  ?? Ambulatory referral to ENT  ?  Referral Priority:   Routine  ?  Referral Type:   Consultation  ?  Referral Reason:   Specialty Services Required  ?  Requested Specialty:   Otolaryngology  ?  Number of Visits Requested:   1  ?Screenings: Per lab orders.  Additional labs ordered due to fatigue. ?Immunizations: Up-to-date ?Anticipatory guidance/risk factor reduction: Recommendations per AVS. ?

## 2021-11-01 NOTE — Progress Notes (Signed)
?Enan Outman - 41 y.o. male MRN [id number]  Date of birth: 1981-03-18 ? ?Subjective ?Chief Complaint  ?Patient presents with  ? Establish Care  ?   ?  ? Annual Exam  ? ? ?HPI ?Dorrian is a 41 year old male here today for initial visit to establish care.  Overall he has been in [facility].  He does see Dr. Tamala Julian for osteopathic manipulations as needed.  Reports having some fatigue over the past several months.  His wife has told  him that he snores fairly heavily.  He does feel that his sleep is not restful.  He has seen ENT in the past and was told that he had septal deviation which may be contributing to chronic rhinitis and sleep issues.  He would like to return to ENT for further evaluation. ? ?He would like to have updated labs.   ? ?He does stay very active.  He works as a Health and safety inspector at [employer] which requires him to walk quite a bit of stay.  Feels like his diet is pretty healthy. ? ?He is a non-smoker.  He does have about 10 drinks per week which is spread out throughout the week. ? ?He is up-to-date on tetanus vaccine. ? ?Review of Systems  ?Constitutional:  Negative for chills, fever, malaise/fatigue and weight loss.  ?HENT:  Negative for congestion, ear pain and sore throat.   ?Eyes:  Negative for blurred vision, double vision and pain.  ?Respiratory:  Negative for cough and shortness of breath.   ?Cardiovascular:  Negative for chest pain and palpitations.  ?Gastrointestinal:  Negative for abdominal pain, blood in stool, constipation, heartburn and nausea.  ?Genitourinary:  Negative for dysuria and urgency.  ?Musculoskeletal:  Negative for joint pain and myalgias.  ?Neurological:  Negative for dizziness and headaches.  ?Endo/Heme/Allergies:  Does not bruise/bleed easily.  ?Psychiatric/Behavioral:  Negative for depression. The patient is not nervous/anxious and does not have insomnia.   ? ? ? ? ? ? ?No Known Allergies ? ?Past Medical History:  ?Diagnosis Date  ? Sinus congestion   ? ? ?Past  Surgical History:  ?Procedure Laterality Date  ? APPENDECTOMY    ? NASAL SINUS SURGERY    ? ? ?Social History  ? ?Socioeconomic History  ? Marital status: Married  ?  Spouse name: Jap Haasch  ? Number of children: Not on file  ? Years of education: Not on file  ? Highest education level: Not on file  ?Occupational History  ? Occupation: Freight forwarder  ?Tobacco Use  ? Smoking status: Never  ?  Passive exposure: Never  ? Smokeless tobacco: Never  ?Vaping Use  ? Vaping Use: Never used  ?Substance and Sexual Activity  ? Alcohol use: Yes  ?  Alcohol/week: 10.0 standard drinks  ?  Types: 10 Standard drinks or equivalent per week  ?  Comment: liquor most days  ? Drug use: Never  ? Sexual activity: Yes  ?  Partners: Female  ?Other Topics Concern  ? Not on file  ?Social History Narrative  ? Not on file  ? ?Social Determinants of Health  ? ?Financial Resource Strain: Not on file  ?Food Insecurity: Not on file  ?Transportation Needs: Not on file  ?Physical Activity: Not on file  ?Stress: Not on file  ?Social Connections: Not on file  ? ? ?Family History  ?Problem Relation Age of Onset  ? Hypertension Father   ? Diabetes Father   ? ? ?Health Maintenance  ?Topic Date Due  ?  HIV Screening  Never done  ? Hepatitis C Screening  Never done  ? COVID-19 Vaccine (1) 03/23/2022 (Originally 04/16/1981)  ? INFLUENZA VACCINE  02/20/2022  ? TETANUS/TDAP  09/28/2024  ? HPV VACCINES  Aged Out  ? ? ? ?----------------------------------------------------------------------------------------------------------------------------------------------------------------------------------------------------------------- ?Physical Exam ?BP (!) 141/79 (BP Location: Left Arm, Patient Position: Sitting, Cuff Size: Normal)   Pulse 73   Ht 5' 10.28" (1.785 m)   Wt 204 lb 11.2 oz (92.9 kg)   SpO2 97%   BMI 29.14 kg/m?  ? ?Physical Exam ?Constitutional:   ?   General: He is not in acute distress. ?HENT:  ?   Head: Normocephalic and atraumatic.  ?   Right Ear:  Tympanic membrane and external ear normal.  ?   Left Ear: Tympanic membrane and external ear normal.  ?Eyes:  ?   General: No scleral icterus. ?Neck:  ?   Thyroid: No thyromegaly.  ?Cardiovascular:  ?   Rate and Rhythm: Normal rate and regular rhythm.  ?   Heart sounds: Normal heart sounds.  ?Pulmonary:  ?   Effort: Pulmonary effort is normal.  ?   Breath sounds: Normal breath sounds.  ?Abdominal:  ?   General: Bowel sounds are normal. There is no distension.  ?   Palpations: Abdomen is soft.  ?   Tenderness: There is no abdominal tenderness. There is no guarding.  ?Musculoskeletal:  ?   Cervical back: Normal range of motion.  ?Lymphadenopathy:  ?   Cervical: No cervical adenopathy.  ?Skin: ?   General: Skin is warm and dry.  ?   Findings: No rash.  ?Neurological:  ?   Mental Status: He is alert and oriented to person, place, and time.  ?   Cranial Nerves: No cranial nerve deficit.  ?   Motor: No abnormal muscle tone.  ?Psychiatric:     ?   Mood and Affect: Mood normal.     ?   Behavior: Behavior normal.  ? ? ?------------------------------------------------------------------------------------------------------------------------------------------------------------------------------------------------------------------- ?Assessment and Plan ? ?Nasal congestion ?He has chronic nasal congestion rhinitis.  History of previous sinus surgery however feels that symptoms are worsening.  Feels like this may be affecting his sleep.  Referral placed to ENT.  We did discuss that if ENT evaluation did not show any significant concerns I would recommend that we proceed with sleep study. ? ?Well adult exam ?Well adult ?Orders Placed This Encounter  ?Procedures  ? COMPLETE METABOLIC PANEL WITH GFR  ? CBC with Differential  ? Lipid Panel w/reflex Direct LDL  ? TSH  ? B12  ? Testosterone  ? Vitamin D (25 hydroxy)  ? Ambulatory referral to ENT  ?  Referral Priority:   Routine  ?  Referral Type:   Consultation  ?  Referral Reason:    Specialty Services Required  ?  Requested Specialty:   Otolaryngology  ?  Number of Visits Requested:   1  ?Screenings: Per lab orders.  Additional labs ordered due to fatigue. ?Immunizations: Up-to-date ?Anticipatory guidance/risk factor reduction: Recommendations per AVS. ? ? ?No orders of the defined types were placed in this encounter. ? ? ?No follow-ups on file. ? ? ? ?This visit occurred during the SARS-CoV-2 public health emergency.  Safety protocols were in place, including screening questions prior to the visit, additional usage of staff PPE, and extensive cleaning of exam room while observing appropriate contact time as indicated for disinfecting solutions.  ? ?

## 2021-11-02 LAB — VITAMIN D 25 HYDROXY (VIT D DEFICIENCY, FRACTURES): Vit D, 25-Hydroxy: 38 ng/mL (ref 30–100)

## 2021-11-02 LAB — LIPID PANEL W/REFLEX DIRECT LDL
Cholesterol: 199 mg/dL (ref <200)
HDL: 37 mg/dL — ABNORMAL LOW (ref >=40)
LDL Cholesterol (Calc): 113 mg/dL (calc) — ABNORMAL HIGH
Non-HDL Cholesterol (Calc): 162 mg/dL (calc) — ABNORMAL HIGH (ref <130)
Total CHOL/HDL Ratio: 5.4 (calc) — ABNORMAL HIGH (ref <5.0)
Triglycerides: 344 mg/dL — ABNORMAL HIGH (ref <150)

## 2021-11-02 LAB — COMPLETE METABOLIC PANEL WITH GFR
AG Ratio: 1.7 (calc) (ref 1.0–2.5)
ALT: 34 U/L (ref 9–46)
AST: 23 U/L (ref 10–40)
Albumin: 4.7 g/dL (ref 3.6–5.1)
Alkaline phosphatase (APISO): 39 U/L (ref 36–130)
BUN: 11 mg/dL (ref 7–25)
CO2: 31 mmol/L (ref 20–32)
Calcium: 9.9 mg/dL (ref 8.6–10.3)
Chloride: 102 mmol/L (ref 98–110)
Creat: 1.21 mg/dL (ref 0.60–1.29)
Globulin: 2.7 g/dL (calc) (ref 1.9–3.7)
Glucose, Bld: 83 mg/dL (ref 65–139)
Potassium: 4.2 mmol/L (ref 3.5–5.3)
Sodium: 141 mmol/L (ref 135–146)
Total Bilirubin: 1.7 mg/dL — ABNORMAL HIGH (ref 0.2–1.2)
Total Protein: 7.4 g/dL (ref 6.1–8.1)
eGFR: 78 mL/min/{1.73_m2} (ref >=60)

## 2021-11-02 LAB — TESTOSTERONE: Testosterone: 156 ng/dL — ABNORMAL LOW (ref 250–827)

## 2021-11-02 LAB — CBC WITH DIFFERENTIAL/PLATELET
Absolute Monocytes: 413 cells/uL (ref 200–950)
Basophils Absolute: 59 cells/uL (ref 0–200)
Basophils Relative: 1 %
Eosinophils Absolute: 112 cells/uL (ref 15–500)
Eosinophils Relative: 1.9 %
HCT: 45.4 % (ref 38.5–50.0)
Hemoglobin: 15.7 g/dL (ref 13.2–17.1)
Lymphs Abs: 2036 cells/uL (ref 850–3900)
MCH: 32.4 pg (ref 27.0–33.0)
MCHC: 34.6 g/dL (ref 32.0–36.0)
MCV: 93.6 fL (ref 80.0–100.0)
MPV: 10.8 fL (ref 7.5–12.5)
Monocytes Relative: 7 %
Neutro Abs: 3280 cells/uL (ref 1500–7800)
Neutrophils Relative %: 55.6 %
Platelets: 210 10*3/uL (ref 140–400)
RBC: 4.85 10*6/uL (ref 4.20–5.80)
RDW: 12.4 % (ref 11.0–15.0)
Total Lymphocyte: 34.5 %
WBC: 5.9 10*3/uL (ref 3.8–10.8)

## 2021-11-02 LAB — VITAMIN B12: Vitamin B-12: 694 pg/mL (ref 200–1100)

## 2021-11-02 LAB — TSH: TSH: 1.26 mIU/L (ref 0.40–4.50)

## 2021-11-10 ENCOUNTER — Other Ambulatory Visit: Payer: Self-pay | Admitting: Family Medicine

## 2021-11-10 ENCOUNTER — Telehealth: Payer: Self-pay | Admitting: Family Medicine

## 2021-11-10 DIAGNOSIS — R7989 Other specified abnormal findings of blood chemistry: Secondary | ICD-10-CM

## 2021-11-10 NOTE — Telephone Encounter (Signed)
Patient called to get the results of his labs  ?

## 2021-11-25 LAB — TESTOSTERONE: Testosterone: 263 ng/dL (ref 250–827)

## 2021-11-30 ENCOUNTER — Other Ambulatory Visit: Payer: Self-pay | Admitting: Family Medicine

## 2021-11-30 MED ORDER — TESTOSTERONE 50 MG/5GM (1%) TD GEL: 5.0000 g | Freq: Every day | TRANSDERMAL | 1 refills | Status: DC

## 2021-11-30 NOTE — Progress Notes (Signed)
?Terrilee Files D.O. ?[facility] ?[address] [place] [address] ?Phone: [phone] ?Subjective:   ?I, Nadine Counts, am serving as a Neurosurgeon for Dr. Antoine Primas. ?This visit occurred during the SARS-CoV-2 public health emergency.  Safety protocols were in place, including screening questions prior to the visit, additional usage of staff PPE, and extensive cleaning of exam room while observing appropriate contact time as indicated for disinfecting solutions.  ? ?I'm seeing this patient by the request  of:  Everrett Coombe, DO ? ?CC: Low back pain, upper neck pain ? ?[id number]  ?Wayne [name] is a 41 y.o. male coming in with complaint of back and neck pain. OMT 10/03/2021. Patient states same per usual. No new complaints.  More stress noted. ? ?Medications patient has been prescribed: None ? ? ? ?  ? ? ? ? ?Reviewed prior external information including notes and imaging from previsou exam, outside providers and external EMR if available.  ? ?As well as notes that were available from care everywhere and other healthcare systems. ? ?Past medical history, social, surgical and family history all reviewed in electronic medical record.  No pertanent information unless stated regarding to the chief complaint.  ? ?Past Medical History:  ?Diagnosis Date  ? Sinus congestion   ?  ?No Known Allergies ? ? ?Review of Systems: ? No headache, visual changes, nausea, vomiting, diarrhea, constipation, dizziness, abdominal pain, skin rash, fevers, chills, night sweats, weight loss, swollen lymph nodes, body aches, joint swelling, chest pain, shortness of breath, mood changes. POSITIVE muscle aches ? ?Objective  ?Blood pressure 126/88, pulse 85, height 5\' 10"  (1.778 m), weight 203 lb (92.1 kg), SpO2 97 %. ?  ?General: No apparent distress alert and oriented x3 mood and affect normal, dressed appropriately.  ?HEENT: Pupils equal, extraocular movements intact  ?Respiratory: Patient's speak in full sentences and does not  appear short of breath  ?Cardiovascular: No lower extremity edema, non tender, no erythema  ?Low back exam does have some loss of lordosis.  Patient does have some tightness noted in the right side of the paraspinal musculature.  Patient does have tightness of the hip flexors right greater than left. ? ?Osteopathic findings ? ?C2 flexed rotated and side bent right ?C5 flexed rotated and side bent left ?T3 extended rotated and side bent right inhaled rib ?T7 extended rotated and side bent left ?L1 flexed rotated and side bent right ?Sacrum right on right ? ? ? ? ?  ?Assessment and Plan: ? ?Low back pain ?Patient has had some tightness.  Doing manual manipulation on his own from time to time that does seem to be helping.  Discussed icing regimen and home exercises, which activities to do and which ones to avoid, increase activity slowly.  Discussed icing regimen and home exercises.  Follow-up again in 6 to 8 weeks patient is in [place] season at this moment and likely will be needing to see me a little more increased frequency due to the increase in activity but also sitting at work.  ? ?Nonallopathic problems ? ?Decision today to treat with OMT was based on Physical Exam ? ?After verbal consent patient was treated with HVLA, ME, FPR techniques in cervical, rib, thoracic, lumbar, and sacral  areas ? ?Patient tolerated the procedure well with improvement in symptoms ? ?Patient given exercises, stretches and lifestyle modifications ? ?See medications in patient instructions if given ? ?Patient will follow up in 4-8 weeks ? ?  ? ? ?TThe above documentation has been  reviewed and is accurate and complete Lyndal Pulley, DO ? ? ? ?  ? ? Note: This dictation was prepared with Dragon dictation along with smaller phrase technology. Any transcriptional errors that result from this process are unintentional.    ?  ?  ? ?

## 2021-12-05 ENCOUNTER — Ambulatory Visit: Payer: BC Managed Care – PPO | Admitting: Family Medicine

## 2021-12-05 VITALS — BP 126/88 | HR 85 | Ht 70.0 in | Wt 203.0 lb

## 2021-12-05 DIAGNOSIS — M545 Low back pain, unspecified: Secondary | ICD-10-CM | POA: Diagnosis not present

## 2021-12-05 DIAGNOSIS — M9903 Segmental and somatic dysfunction of lumbar region: Secondary | ICD-10-CM

## 2021-12-05 DIAGNOSIS — M9901 Segmental and somatic dysfunction of cervical region: Secondary | ICD-10-CM | POA: Diagnosis not present

## 2021-12-05 DIAGNOSIS — M9908 Segmental and somatic dysfunction of rib cage: Secondary | ICD-10-CM | POA: Diagnosis not present

## 2021-12-05 DIAGNOSIS — G8929 Other chronic pain: Secondary | ICD-10-CM | POA: Diagnosis not present

## 2021-12-05 DIAGNOSIS — M9902 Segmental and somatic dysfunction of thoracic region: Secondary | ICD-10-CM

## 2021-12-05 DIAGNOSIS — M9904 Segmental and somatic dysfunction of sacral region: Secondary | ICD-10-CM

## 2021-12-05 NOTE — Patient Instructions (Signed)
[place] to see you   

## 2021-12-05 NOTE — Assessment & Plan Note (Signed)
Patient has had some tightness.  Doing manual manipulation on his own from time to time that does seem to be helping.  Discussed icing regimen and home exercises, which activities to do and which ones to avoid, increase activity slowly.  Discussed icing regimen and home exercises.  Follow-up again in 6 to 8 weeks patient is in [place] season at this moment and likely will be needing to see me a little more increased frequency due to the increase in activity but also sitting at work. ?

## 2021-12-15 ENCOUNTER — Telehealth: Payer: Self-pay

## 2021-12-15 NOTE — Telephone Encounter (Addendum)
Initiated Prior authorization for:Testosterone 50 MG/5GM(1%) gel  Via: Covermymeds Case/Key:[id number] Status: Denied as of 12/15/21 Reason:Our prior authorization criteria for Androgens: Testosterone Products have not been met. From the records that we have received, Testosterone 1% gel was denied for these reasons: 1) Two low testosterone levels have not been sent to us. The labs must be drawn in the morning and must be from two different days. 2) A lab value from within the last 12 months was not sent to us. 3) A second lab value from within the last 24 months was not sent to us. Since the criteria has not been met, we are not able to approve. Please look at our list of covered drugs, also known as the formulary, to see what is covered. ADDITIONAL INFORMATION FOR YOUR HEALTH CARE PROVIDER: This request has not been approved because our prior authorization criteria for Androgens: Testosterone Products have not been met. From the information we have received, the member does not meet number(s) 3, 4, 5 of our prior authorization criteria for Initial Therapy. The reason for denial is explained to the member above. The criteria are listed here. 1) Member has a diagnosis of primary or secondary hypogonadism and does NOT have age-related hypogonadism; AND 2) Member has symptoms of hypogonadism; AND 3) TWO (2) morning testosterone levels on separate days fall below the normal range for a healthy adult male are provided with the request; AND 4) Two (2) lab values are submitted with the request (date, time, level, and reference range must be documented); AND 5) One lab value must be from within the last 12 months, AND the second lab value must be from within the last 24 months. Since criteria have not been met, we are unable to approve coverage for this drug at this time. Please refer to our formulary for information on what is covered. Prior authorization may be required and quantity limits may apply to covered  drugs. Notified Pt via: Mychart  

## 2022-01-09 ENCOUNTER — Ambulatory Visit: Payer: BC Managed Care – PPO | Admitting: Family Medicine

## 2022-01-10 NOTE — Progress Notes (Unsigned)
  [facility] [address] [place] [address] Phone: [phone] Subjective:    I'm seeing this patient by the request  of:  Everrett Coombe, DO  CC:   [id number]  Clayden Withem is a 41 y.o. male coming in with complaint of back and neck pain. OMT 12/05/2021. Patient states   Medications patient has been prescribed: None  Taking:         Reviewed prior external information including notes and imaging from previsou exam, outside providers and external EMR if available.   As well as notes that were available from care everywhere and other healthcare systems.  Past medical history, social, surgical and family history all reviewed in electronic medical record.  No pertanent information unless stated regarding to the chief complaint.   Past Medical History:  Diagnosis Date   Sinus congestion     No Known Allergies   Review of Systems:  No headache, visual changes, nausea, vomiting, diarrhea, constipation, dizziness, abdominal pain, skin rash, fevers, chills, night sweats, weight loss, swollen lymph nodes, body aches, joint swelling, chest pain, shortness of breath, mood changes. POSITIVE muscle aches  Objective  There were no vitals taken for this visit.   General: No apparent distress alert and oriented x3 mood and affect normal, dressed appropriately.  HEENT: Pupils equal, extraocular movements intact  Respiratory: Patient's speak in full sentences and does not appear short of breath  Cardiovascular: No lower extremity edema, non tender, no erythema  Gait MSK:  Back   Osteopathic findings  C2 flexed rotated and side bent right C6 flexed rotated and side bent left T3 extended rotated and side bent right inhaled rib T9 extended rotated and side bent left L2 flexed rotated and side bent right Sacrum right on right       Assessment and Plan:  No problem-specific Assessment & Plan notes found for this encounter.     Nonallopathic problems  Decision today to treat with OMT was based on Physical Exam  After verbal consent patient was treated with HVLA, ME, FPR techniques in cervical, rib, thoracic, lumbar, and sacral  areas  Patient tolerated the procedure well with improvement in symptoms  Patient given exercises, stretches and lifestyle modifications  See medications in patient instructions if given  Patient will follow up in 4-8 weeks             Note: This dictation was prepared with Dragon dictation along with smaller phrase technology. Any transcriptional errors that result from this process are unintentional.

## 2022-01-15 ENCOUNTER — Telehealth: Payer: Self-pay

## 2022-01-15 ENCOUNTER — Other Ambulatory Visit: Payer: Self-pay | Admitting: Family Medicine

## 2022-01-16 ENCOUNTER — Ambulatory Visit: Payer: BC Managed Care – PPO | Admitting: Family Medicine

## 2022-01-16 ENCOUNTER — Encounter: Payer: Self-pay | Admitting: Family Medicine

## 2022-01-16 VITALS — BP 118/68 | HR 61 | Ht 70.0 in | Wt 201.0 lb

## 2022-01-16 DIAGNOSIS — M9903 Segmental and somatic dysfunction of lumbar region: Secondary | ICD-10-CM | POA: Diagnosis not present

## 2022-01-16 DIAGNOSIS — G8929 Other chronic pain: Secondary | ICD-10-CM | POA: Diagnosis not present

## 2022-01-16 DIAGNOSIS — M9908 Segmental and somatic dysfunction of rib cage: Secondary | ICD-10-CM

## 2022-01-16 DIAGNOSIS — M9904 Segmental and somatic dysfunction of sacral region: Secondary | ICD-10-CM

## 2022-01-16 DIAGNOSIS — M9901 Segmental and somatic dysfunction of cervical region: Secondary | ICD-10-CM | POA: Diagnosis not present

## 2022-01-16 DIAGNOSIS — M9902 Segmental and somatic dysfunction of thoracic region: Secondary | ICD-10-CM

## 2022-01-16 DIAGNOSIS — M545 Low back pain, unspecified: Secondary | ICD-10-CM | POA: Diagnosis not present

## 2022-02-15 ENCOUNTER — Other Ambulatory Visit: Payer: Self-pay

## 2022-02-15 DIAGNOSIS — R7989 Other specified abnormal findings of blood chemistry: Secondary | ICD-10-CM

## 2022-02-15 MED ORDER — TESTOSTERONE 50 MG/5GM (1%) TD GEL: 5.0000 g | Freq: Every day | TRANSDERMAL | 1 refills | Status: DC

## 2022-02-15 NOTE — Telephone Encounter (Signed)
Wayne [name] is having labs drawn today.

## 2022-02-16 LAB — TESTOSTERONE: Testosterone: 1097 ng/dL — ABNORMAL HIGH (ref 250–827)

## 2022-02-16 NOTE — Progress Notes (Unsigned)
  [facility] [address] [place] [address] Phone: [phone] Subjective:   INadine [name], am serving as a scribe for Dr. Antoine Primas.  I'm seeing this patient by the request  of:  Everrett Coombe, DO  CC: Back and neck pain follow-up  [id number]  Wayne [name] is a 41 y.o. male coming in with complaint of back and neck pain. OMT 01/16/2022. Patient states same per usual. No new issues.  Medications patient has been prescribed: None  Taking:       Past Medical History:  Diagnosis Date   Sinus congestion     No Known Allergies   Review of Systems:  No headache, visual changes, nausea, vomiting, diarrhea, constipation, dizziness, abdominal pain, skin rash, fevers, chills, night sweats, weight loss, swollen lymph nodes, body aches, joint swelling, chest pain, shortness of breath, mood changes. POSITIVE muscle aches  Objective  Blood pressure 122/84, pulse 75, height 5\' 10"  (1.778 m), weight 194 lb (88 kg), SpO2 97 %.   General: No apparent distress alert and oriented x3 mood and affect normal, dressed appropriately.  HEENT: Pupils equal, extraocular movements intact  Respiratory: Patient's speak in full sentences and does not appear short of breath  Cardiovascular: No lower extremity edema, non tender, no erythema  Gait MSK:  Back mild tightness in the lower back.  Tender to palpation in the paraspinal musculature.  Patient does have mild low back pain with some tightness around the sacroiliac joint bilaterally.  Osteopathic findings  C2 flexed rotated and side bent right C6 flexed rotated and side bent left T3 extended rotated and side bent right inhaled rib T9 extended rotated and side bent left L2 flexed rotated and side bent right Sacrum right on right       Assessment and Plan:  Low back pain Low back pain still noted somewhat.  Patient has had intermittent radiculopathy of the cervical spine that we will continue to  follow.  Patient still wants to hold on anything such as medications on a regular basis.  We have discussed before.  Follow-up with me again in 6 to 8 weeks    Nonallopathic problems  Decision today to treat with OMT was based on Physical Exam  After verbal consent patient was treated with HVLA, ME, FPR techniques in cervical, rib, thoracic, lumbar, and sacral  areas  Patient tolerated the procedure well with improvement in symptoms  Patient given exercises, stretches and lifestyle modifications  See medications in patient instructions if given  Patient will follow up in 6-8 weeks     The above documentation has been reviewed and is accurate and complete , DO         Note: This dictation was prepared with Dragon dictation along with smaller phrase technology. Any transcriptional errors that result from this process are unintentional.

## 2022-02-20 ENCOUNTER — Ambulatory Visit: Payer: BC Managed Care – PPO | Admitting: Family Medicine

## 2022-02-20 VITALS — BP 122/84 | HR 75 | Ht 70.0 in | Wt 194.0 lb

## 2022-02-20 DIAGNOSIS — G8929 Other chronic pain: Secondary | ICD-10-CM

## 2022-02-20 DIAGNOSIS — M9908 Segmental and somatic dysfunction of rib cage: Secondary | ICD-10-CM

## 2022-02-20 DIAGNOSIS — M9904 Segmental and somatic dysfunction of sacral region: Secondary | ICD-10-CM

## 2022-02-20 DIAGNOSIS — M545 Low back pain, unspecified: Secondary | ICD-10-CM

## 2022-02-20 DIAGNOSIS — M5412 Radiculopathy, cervical region: Secondary | ICD-10-CM

## 2022-02-20 DIAGNOSIS — M9903 Segmental and somatic dysfunction of lumbar region: Secondary | ICD-10-CM | POA: Diagnosis not present

## 2022-02-20 DIAGNOSIS — M9902 Segmental and somatic dysfunction of thoracic region: Secondary | ICD-10-CM

## 2022-02-20 DIAGNOSIS — M9901 Segmental and somatic dysfunction of cervical region: Secondary | ICD-10-CM | POA: Diagnosis not present

## 2022-02-20 NOTE — Assessment & Plan Note (Signed)
Low back pain still noted somewhat.  Patient has had intermittent radiculopathy of the cervical spine that we will continue to follow.  Patient still wants to hold on anything such as medications on a regular basis.  We have discussed before.  Follow-up with me again in 6 to 8 weeks

## 2022-02-20 NOTE — Patient Instructions (Signed)
[place] to see you! See you again in 6-8 weeks 

## 2022-03-27 NOTE — Progress Notes (Signed)
  [facility] [address] [place] [address] Phone: [phone] Subjective:   INadine Counts, am serving as a scribe for Dr. Antoine Primas.  I'm seeing this patient by the request  of:  Everrett Coombe, DO  CC: back and neck pain follow up   [id number]  Wayne [name] is a 41 y.o. male coming in with complaint of back and neck pain. OMT 02/20/2022. Patient states he's doing well. Here for manipulation. No new issues.   Medications patient has been prescribed: None  Taking:         Reviewed prior external information including notes and imaging from previsou exam, outside providers and external EMR if available.   As well as notes that were available from care everywhere and other healthcare systems.  Past medical history, social, surgical and family history all reviewed in electronic medical record.  No pertanent information unless stated regarding to the chief complaint.   Past Medical History:  Diagnosis Date   Sinus congestion     No Known Allergies   Review of Systems:  No headache, visual changes, nausea, vomiting, diarrhea, constipation, dizziness, abdominal pain, skin rash, fevers, chills, night sweats, weight loss, swollen lymph nodes, body aches, joint swelling, chest pain, shortness of breath, mood changes. POSITIVE muscle aches  Objective  Blood pressure 118/80, pulse 70, height 5\' 10"  (1.778 m), weight 195 lb (88.5 kg), SpO2 96 %.   General: No apparent distress alert and oriented x3 mood and affect normal, dressed appropriately.  HEENT: Pupils equal, extraocular movements intact  Respiratory: Patient's speak in full sentences and does not appear short of breath  Cardiovascular: No lower extremity edema, non tender, no erythema  MSK:  Back back does have loss lordosis.  Some tightness in the paraspinal musculature of the lumbar spine.  Some limited sidebending bilaterally as well.  Osteopathic findings  C2 flexed rotated  and side bent right C6 flexed rotated and side bent left T3 extended rotated and side bent right inhaled rib T8 extended rotated and side bent left L1 flexed rotated and side bent right L4 F RS left   Sacrum right on right       Assessment and Plan:  Low back pain Chronic but stable at this moment.  Discussed posture and ergonomics.  Which activities to do which ones to avoid.  Increase activity slowly otherwise.  Follow-up with me again in 6 to 8 weeks    Nonallopathic problems  Decision today to treat with OMT was based on Physical Exam  After verbal consent patient was treated with HVLA, ME, FPR techniques in cervical, rib, thoracic, lumbar, and sacral  areas  Patient tolerated the procedure well with improvement in symptoms  Patient given exercises, stretches and lifestyle modifications  See medications in patient instructions if given  Patient will follow up in 4-8 weeks    The above documentation has been reviewed and is accurate and complete , DO          Note: This dictation was prepared with Dragon dictation along with smaller phrase technology. Any transcriptional errors that result from this process are unintentional.

## 2022-04-02 ENCOUNTER — Ambulatory Visit: Payer: BC Managed Care – PPO | Admitting: Family Medicine

## 2022-04-02 VITALS — BP 118/80 | HR 70 | Ht 70.0 in | Wt 195.0 lb

## 2022-04-02 DIAGNOSIS — M9908 Segmental and somatic dysfunction of rib cage: Secondary | ICD-10-CM

## 2022-04-02 DIAGNOSIS — M9903 Segmental and somatic dysfunction of lumbar region: Secondary | ICD-10-CM | POA: Diagnosis not present

## 2022-04-02 DIAGNOSIS — M9904 Segmental and somatic dysfunction of sacral region: Secondary | ICD-10-CM

## 2022-04-02 DIAGNOSIS — M9902 Segmental and somatic dysfunction of thoracic region: Secondary | ICD-10-CM

## 2022-04-02 DIAGNOSIS — M9901 Segmental and somatic dysfunction of cervical region: Secondary | ICD-10-CM

## 2022-04-02 DIAGNOSIS — R7989 Other specified abnormal findings of blood chemistry: Secondary | ICD-10-CM | POA: Insufficient documentation

## 2022-04-02 NOTE — Assessment & Plan Note (Signed)
Low testosterone noted.  Discussed which activities to do which ones to avoid.  Increase activity slowly.  Follow-up again in 6 to 8 weeks.

## 2022-04-02 NOTE — Patient Instructions (Addendum)
Referral to Urology [place] to see you! Doing well

## 2022-04-02 NOTE — Assessment & Plan Note (Signed)
Chronic but stable at this moment.  Discussed posture and ergonomics.  Which activities to do which ones to avoid.  Increase activity slowly otherwise.  Follow-up with me again in 6 to 8 weeks

## 2022-05-11 NOTE — Progress Notes (Unsigned)
  Wayne [name] Phone: [phone] Subjective:   Wayne [name], am serving as a scribe for Dr. Hulan Saas.  I'm seeing this patient by the request  of:  Wayne Nutting, DO  CC: Back and neck pain follow-up  [id number]  Wayne [name] is a 41 y.o. male coming in with complaint of back and neck pain. OMT 04/02/2022. Patient states that he continues to have symptoms of tightness noted.  Nothing severe enough that stops him from activity.  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  Medications patient has been prescribed: None  Taking:         Reviewed prior external information including notes and imaging from previsou exam, outside providers and external EMR if available.   As well as notes that were available from care everywhere and other healthcare systems.  Past medical history, social, surgical and family history all reviewed in electronic medical record.  [name] pertanent information unless stated regarding to the chief complaint.   Past Medical History:  Diagnosis Date   Sinus congestion     [name] Known Allergies   Review of Systems:  [name] headache, visual changes, nausea, vomiting, diarrhea, constipation, dizziness, abdominal pain, skin rash, fevers, chills, night sweats, weight loss, swollen lymph nodes, body aches, joint swelling, chest pain, shortness of breath, mood changes. POSITIVE muscle aches  Objective  Blood pressure (!) 130/92, pulse 87, height 5\' 10"  (1.778 m), weight 201 lb (91.2 kg), SpO2 98 %.   General: [name] apparent distress alert and oriented x3 mood and affect normal, dressed appropriately.  HEENT: Pupils equal, extraocular movements intact  Respiratory: Patient's speak in full sentences and does not appear short of breath  Cardiovascular: [name] lower extremity edema, non tender, [name] erythema  MSK:  Back low back does have some mild loss of lordosis.  Some tenderness to  palpation in the paraspinal musculature.  Osteopathic findings  C2 flexed rotated and side bent right C6 flexed rotated and side bent left T3 extended rotated and side bent right inhaled rib T8 extended rotated and side bent left L1 flexed rotated and side bent right Sacrum right on right       Assessment and Plan:  Scapular dyskinesis Scapular dyskinesis still noted.  Patient has not been quite as active as usual.  Will be traveling in the near future.  Discussed which activities to do and which ones to avoid.  Discussed icing regimen and home exercises.  Follow-up again in 6 to 8 weeks.    Nonallopathic problems  Decision today to treat with OMT was based on Physical Exam  After verbal consent patient was treated with HVLA, ME, FPR techniques in cervical, rib, thoracic, lumbar, and sacral  areas  Patient tolerated the procedure well with improvement in symptoms  Patient given exercises, stretches and lifestyle modifications  See medications in patient instructions if given  Patient will follow up in 4-8 weeks     The above documentation has been reviewed and is accurate and complete Lyndal Pulley, DO         Note: This dictation was prepared with Dragon dictation along with smaller phrase technology. Any transcriptional errors that result from this process are unintentional.

## 2022-05-14 ENCOUNTER — Ambulatory Visit: Payer: BC Managed Care – PPO | Admitting: Family Medicine

## 2022-05-14 VITALS — BP 130/92 | HR 87 | Ht 70.0 in | Wt 201.0 lb

## 2022-05-14 DIAGNOSIS — M9901 Segmental and somatic dysfunction of cervical region: Secondary | ICD-10-CM

## 2022-05-14 DIAGNOSIS — G2589 Other specified extrapyramidal and movement disorders: Secondary | ICD-10-CM

## 2022-05-14 DIAGNOSIS — M9904 Segmental and somatic dysfunction of sacral region: Secondary | ICD-10-CM | POA: Diagnosis not present

## 2022-05-14 DIAGNOSIS — M9908 Segmental and somatic dysfunction of rib cage: Secondary | ICD-10-CM

## 2022-05-14 DIAGNOSIS — M9903 Segmental and somatic dysfunction of lumbar region: Secondary | ICD-10-CM

## 2022-05-14 DIAGNOSIS — M9902 Segmental and somatic dysfunction of thoracic region: Secondary | ICD-10-CM

## 2022-05-14 NOTE — Assessment & Plan Note (Addendum)
Scapular dyskinesis still noted.  Patient has not been quite as active as usual.  Will be traveling in the near future.  Discussed which activities to do and which ones to avoid.  Discussed icing regimen and home exercises.  Follow-up again in 6 to 8 weeks.  Not taking any medications at this point.  Chronic problem but stable.

## 2022-07-05 NOTE — Progress Notes (Signed)
  [facility] [address] [place] [address] Phone: [phone] Subjective:   Wayne [name], am serving as a scribe for Dr. Antoine Primas.  I'm seeing this patient by the request  of:  Everrett Coombe, DO  CC: Back and neck pain follow-up  [id number]  Wayne [name] is a 41 y.o. male coming in with complaint of back and neck pain. OMT 05/14/2022. Patient states that he pulled muscle in lumbar spine that caused radiating pain into R leg. Pain has dissipated since then.   Medications patient has been prescribed: None  Taking:         Reviewed prior external information including notes and imaging from previsou exam, outside providers and external EMR if available.   As well as notes that were available from care everywhere and other healthcare systems.  Past medical history, social, surgical and family history all reviewed in electronic medical record.  No pertanent information unless stated regarding to the chief complaint.   Past Medical History:  Diagnosis Date   Sinus congestion     No Known Allergies   Review of Systems:  No headache, visual changes, nausea, vomiting, diarrhea, constipation, dizziness, abdominal pain, skin rash, fevers, chills, night sweats, weight loss, swollen lymph nodes, body aches, joint swelling, chest pain, shortness of breath, mood changes. POSITIVE muscle aches  Objective  Blood pressure 132/88, pulse (!) 59, height 5\' 10"  (1.778 m), weight 203 lb (92.1 kg), SpO2 99 %.   General: No apparent distress alert and oriented x3 mood and affect normal, dressed appropriately.  HEENT: Pupils equal, extraocular movements intact  Respiratory: Patient's speak in full sentences and does not appear short of breath  Low back exam does have some loss of lordosis.  Some tightness noted of the neck as well still noted.  Also tightness of the lower back.  No true radicular symptoms at the moment.  Osteopathic  findings  C3 flexed rotated and side bent right C6 flexed rotated and side bent left T3 extended rotated and side bent right inhaled rib T9 extended rotated and side bent left L2 flexed rotated and side bent right Sacrum right on right       Assessment and Plan:  Cervical radiculopathy at C8 Continues to have tightness of the neck noted.  Discussed posture and ergonomics, discussed which activities to do and which ones to avoid.  Increase activity slowly.  Patient is still not have anything that is stopping him from activities on a regular basis.  Follow-up with me again in 2 to 3 months    Nonallopathic problems  Decision today to treat with OMT was based on Physical Exam  After verbal consent patient was treated with HVLA, ME, FPR techniques in cervical, rib, thoracic, lumbar, and sacral  areas  Patient tolerated the procedure well with improvement in symptoms  Patient given exercises, stretches and lifestyle modifications  See medications in patient instructions if given  Patient will follow up in 4-8 weeks     The above documentation has been reviewed and is accurate and complete , DO         Note: This dictation was prepared with Dragon dictation along with smaller phrase technology. Any transcriptional errors that result from this process are unintentional.

## 2022-07-10 ENCOUNTER — Encounter: Payer: Self-pay | Admitting: Family Medicine

## 2022-07-10 ENCOUNTER — Ambulatory Visit: Payer: BC Managed Care – PPO | Admitting: Family Medicine

## 2022-07-10 VITALS — BP 132/88 | HR 59 | Ht 70.0 in | Wt 203.0 lb

## 2022-07-10 DIAGNOSIS — M9903 Segmental and somatic dysfunction of lumbar region: Secondary | ICD-10-CM | POA: Diagnosis not present

## 2022-07-10 DIAGNOSIS — M9908 Segmental and somatic dysfunction of rib cage: Secondary | ICD-10-CM | POA: Diagnosis not present

## 2022-07-10 DIAGNOSIS — M9901 Segmental and somatic dysfunction of cervical region: Secondary | ICD-10-CM | POA: Diagnosis not present

## 2022-07-10 DIAGNOSIS — M5412 Radiculopathy, cervical region: Secondary | ICD-10-CM

## 2022-07-10 DIAGNOSIS — M9902 Segmental and somatic dysfunction of thoracic region: Secondary | ICD-10-CM

## 2022-07-10 DIAGNOSIS — M9904 Segmental and somatic dysfunction of sacral region: Secondary | ICD-10-CM

## 2022-07-10 NOTE — Assessment & Plan Note (Signed)
Continues to have tightness of the neck noted.  Discussed posture and ergonomics, discussed which activities to do and which ones to avoid.  Increase activity slowly.  Patient is still not have anything that is stopping him from activities on a regular basis.  Follow-up with me again in 2 to 3 months

## 2022-08-03 ENCOUNTER — Ambulatory Visit: Payer: BC Managed Care – PPO | Admitting: Family Medicine

## 2022-08-03 ENCOUNTER — Ambulatory Visit (INDEPENDENT_AMBULATORY_CARE_PROVIDER_SITE_OTHER): Payer: BC Managed Care – PPO

## 2022-08-03 VITALS — BP 122/90 | HR 77 | Ht 70.0 in | Wt 207.0 lb

## 2022-08-03 DIAGNOSIS — M25551 Pain in right hip: Secondary | ICD-10-CM | POA: Diagnosis not present

## 2022-08-03 DIAGNOSIS — M9903 Segmental and somatic dysfunction of lumbar region: Secondary | ICD-10-CM | POA: Diagnosis not present

## 2022-08-03 DIAGNOSIS — M545 Low back pain, unspecified: Secondary | ICD-10-CM | POA: Diagnosis not present

## 2022-08-03 DIAGNOSIS — M9904 Segmental and somatic dysfunction of sacral region: Secondary | ICD-10-CM

## 2022-08-03 DIAGNOSIS — M9908 Segmental and somatic dysfunction of rib cage: Secondary | ICD-10-CM | POA: Diagnosis not present

## 2022-08-03 DIAGNOSIS — M9902 Segmental and somatic dysfunction of thoracic region: Secondary | ICD-10-CM | POA: Diagnosis not present

## 2022-08-03 DIAGNOSIS — M9901 Segmental and somatic dysfunction of cervical region: Secondary | ICD-10-CM | POA: Diagnosis not present

## 2022-08-03 DIAGNOSIS — G8929 Other chronic pain: Secondary | ICD-10-CM

## 2022-08-03 MED ORDER — PREDNISONE 50 MG PO TABS: 50.0000 mg | ORAL_TABLET | Freq: Every day | ORAL | 0 refills | Status: DC

## 2022-08-03 MED ORDER — TIZANIDINE HCL 2 MG PO TABS: 2.0000 mg | ORAL_TABLET | Freq: Every day | ORAL | 1 refills | Status: DC

## 2022-08-03 NOTE — Patient Instructions (Addendum)
Xray today Zanaflex 2mg  at night for next 3 nights Stay active Prednisone if needed on trip  See me when you get back

## 2022-08-03 NOTE — Assessment & Plan Note (Signed)
Chronic problem with exacerbation.  Given a muscle relaxer secondary to the tightness of the hip flexor.  Hopefully this will be beneficial.  Patient also given a wait-and-see prednisone with patient traveling to [place] in the near future.  Discussed with patient about icing regimen and home exercises otherwise.  Follow-up with me again in 6 to 8 weeks.

## 2022-08-03 NOTE — Progress Notes (Signed)
  [place] Phone: [phone] Subjective:   Wayne [name], am serving as a scribe for Dr. Hulan Saas.  I'm seeing this patient by the request  of:  Wayne Nutting, DO  CC: Back and neck pain  [id number]  Wayne [name] is a 42 y.o. male coming in with complaint of back and neck pain. OMT 07-10-2022. Patient states that he has been having an increase in pain in middle of lumbar spine. Symptoms predominatly on  R side of lumbar spine that radiates into inner thigh and into the patella. Pain today is now lateral hip, down to his knee. Pain has been intermittent. Aggravated back after [place] the beach. Using IBU prn.   Medications patient has been prescribed:   Taking:         Reviewed prior external information including notes and imaging from previsou exam, outside providers and external EMR if available.   As well as notes that were available from care everywhere and other healthcare systems.  Past medical history, social, surgical and family history all reviewed in electronic medical record.  [name] pertanent information unless stated regarding to the chief complaint.   Past Medical History:  Diagnosis Date   Sinus congestion     [name] Known Allergies   Review of Systems:  [name] headache, visual changes, nausea, vomiting, diarrhea, constipation, dizziness, abdominal pain, skin rash, fevers, chills, night sweats, weight loss, swollen lymph nodes, body aches, joint swelling, chest pain, shortness of breath, mood changes. POSITIVE muscle aches  Objective  Blood pressure (!) 122/90, pulse 77, height 5\' 10"  (1.778 m), weight 207 lb (93.9 kg), SpO2 98 %.   General: [name] apparent distress alert and oriented x3 mood and affect normal, dressed appropriately.  HEENT: Pupils equal, extraocular movements intact  Respiratory: Patient's speak in full sentences and does not appear short of breath   Cardiovascular: [name] lower extremity edema, non tender, [name] erythema  Low back exam does have more tightness noted in the paraspinal musculature of the lumbar spine. Send more tightness in the hip flexor right greater than left.  [name] pain with resisted adduction.  Osteopathic findings  C6 flexed rotated and side bent left T3 extended rotated and side bent right inhaled rib T9 extended rotated and side bent left L2 flexed rotated and side bent right L5 flexed rotated and side bent right Sacrum right on right       Assessment and Plan:  [name] problem-specific Assessment & Plan notes found for this encounter.    Nonallopathic problems  Decision today to treat with OMT was based on Physical Exam  After verbal consent patient was treated with HVLA, ME, FPR techniques in cervical, rib, thoracic, lumbar, and sacral  areas  Patient tolerated the procedure well with improvement in symptoms  Patient given exercises, stretches and lifestyle modifications  See medications in patient instructions if given  Patient will follow up in 4-8 weeks             Note: This dictation was prepared with Dragon dictation along with smaller phrase technology. Any transcriptional errors that result from this process are unintentional.

## 2022-08-17 NOTE — Progress Notes (Unsigned)
[place] Phone: [phone] Subjective:   Wayne Wayne [name], am serving as a scribe for Dr. Hulan [name].  I'm seeing this patient by the request  of:  Wayne Nutting, DO  CC: Low back pain follow-up  [id number]  Wayne Wayne [name] is a 42 y.o. male coming in with complaint of back and neck pain. OMT 08/03/2022. Patient states that last week he was on his feet a lot. Pain in R leg and lower back. Radiates into mid shin.  Patient states that since he has been back maybe not having quite as much severe patient.  The patient does states that when he does stand for long amount of time it does seem to be worse.    Medications patient has been prescribed: Prednisone, Zanaflex  Taking:         Reviewed prior external information including notes and imaging from previsou exam, outside providers and external EMR if available.   As well as notes that were available from care everywhere and other healthcare systems.  Past medical history, social, surgical and family history all reviewed in electronic medical record.  Wayne [name] pertanent information unless stated regarding to the chief complaint.   Past Medical History:  Diagnosis Date   Sinus congestion     Wayne [name] Known Allergies   Review of Systems:  Wayne [name] headache, visual changes, nausea, vomiting, diarrhea, constipation, dizziness, abdominal pain, skin rash, fevers, chills, night sweats, weight loss, swollen lymph nodes, body aches, joint swelling, chest pain, shortness of breath, mood changes. POSITIVE muscle aches  Objective  Blood pressure 110/88, pulse 70, height 5\' 10"  (1.778 m), weight 207 lb (93.9 kg), SpO2 98 %.   General: Wayne [name] apparent distress alert and oriented x3 mood and affect normal, dressed appropriately.  HEENT: Pupils equal, extraocular movements intact  Respiratory: Patient's speak in full sentences and does not appear short of breath  Cardiovascular: Wayne [name]  lower extremity edema, non tender, Wayne [name] erythema  Lower back does have some loss of lordosis.  Still has significant tightness with FABER test right greater than left.  Neck exam also has some limited sidebending bilaterally.  Osteopathic findings  C3 flexed rotated and side bent right C6 flexed rotated and side bent left T2 extended rotated and side bent right inhaled rib T5 extended rotated and side bent left L2 flexed rotated and side bent right L4 flexed rotated and side bent left knee pain Sacrum right on right       Assessment and Plan:  Low back pain Patient does have x-ray showing the patient does have some mild spondylosis at the L4 area.  We discussed that if worsening symptoms advanced imaging would be warranted.  Patient is hoping that now that he is back and not traveling as much she can do more of the exercises.  We discussed with patient about icing regimen and home exercises, ice we discussed core strengthening exercises.  Responded relatively well to osteopathic manipulation.  Follow-up with me again in 6 to 8 weeks.    Nonallopathic problems  Decision today to treat with OMT was based on Physical Exam  After verbal consent patient was treated with HVLA, ME, FPR techniques in cervical, rib, thoracic, lumbar, and sacral  areas  Patient tolerated the procedure well with improvement in symptoms  Patient given exercises, stretches and lifestyle modifications  See medications in patient instructions if given  Patient will follow up in 4-8 weeks     The  above documentation has been reviewed and is accurate and complete Wayne Pulley, DO         Note: This dictation was prepared with Dragon dictation along with smaller phrase technology. Any transcriptional errors that result from this process are unintentional.

## 2022-08-21 ENCOUNTER — Ambulatory Visit: Payer: BC Managed Care – PPO | Admitting: Family Medicine

## 2022-08-21 VITALS — BP 110/88 | HR 70 | Ht 70.0 in | Wt 207.0 lb

## 2022-08-21 DIAGNOSIS — M545 Low back pain, unspecified: Secondary | ICD-10-CM | POA: Diagnosis not present

## 2022-08-21 DIAGNOSIS — M9901 Segmental and somatic dysfunction of cervical region: Secondary | ICD-10-CM

## 2022-08-21 DIAGNOSIS — M9908 Segmental and somatic dysfunction of rib cage: Secondary | ICD-10-CM

## 2022-08-21 DIAGNOSIS — G8929 Other chronic pain: Secondary | ICD-10-CM

## 2022-08-21 DIAGNOSIS — M9902 Segmental and somatic dysfunction of thoracic region: Secondary | ICD-10-CM

## 2022-08-21 DIAGNOSIS — M9904 Segmental and somatic dysfunction of sacral region: Secondary | ICD-10-CM

## 2022-08-21 DIAGNOSIS — M9903 Segmental and somatic dysfunction of lumbar region: Secondary | ICD-10-CM

## 2022-08-21 NOTE — Assessment & Plan Note (Signed)
Patient does have x-ray showing the patient does have some mild spondylosis at the L4 area.  We discussed that if worsening symptoms advanced imaging would be warranted.  Patient is hoping that now that he is back and not traveling as much she can do more of the exercises.  We discussed with patient about icing regimen and home exercises, ice we discussed core strengthening exercises.  Responded relatively well to osteopathic manipulation.  Follow-up with me again in 6 to 8 weeks.

## 2022-09-27 NOTE — Progress Notes (Signed)
  [facility] [place] Phone: [phone] Subjective:   Wayne [name], am serving as a scribe for Dr. Hulan Saas.  I'm seeing this patient by the request  of:  Luetta Nutting, DO  CC: Back and neck pain follow-up  [id number]  Jquan Egelston is a 42 y.o. male coming in with complaint of back and neck pain. OMT 08/21/2022. Patient states that he is here for routine OMT. Right knee is hit or miss. 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  Medications patient has been prescribed: zanaflex, prednisone  Taking: Intermittently Zanaflex         Reviewed prior external information including notes and imaging from previsou exam, outside providers and external EMR if available.   As well as notes that were available from care everywhere and other healthcare systems.  Past medical history, social, surgical and family history all reviewed in electronic medical record.  No pertanent information unless stated regarding to the chief complaint.   Past Medical History:  Diagnosis Date   Sinus congestion     No Known Allergies   Review of Systems:  No headache, visual changes, nausea, vomiting, diarrhea, constipation, dizziness, abdominal pain, skin rash, fevers, chills, night sweats, weight loss, swollen lymph nodes, body aches, joint swelling, chest pain, shortness of breath, mood changes. POSITIVE muscle aches  Objective  Blood pressure 122/80, pulse 72, height 5\' 10"  (1.778 m), weight 214 lb (97.1 kg), SpO2 98 %.   General: No apparent distress alert and oriented x3 mood and affect normal, dressed appropriately.  HEENT: Pupils equal, extraocular movements intact  Respiratory: Patient's speak in full sentences and does not appear short of breath  Cardiovascular: No lower extremity edema, non tender, no erythema  Low back does  have some loss of lordosis noted.  Some tenderness to palpation in the paraspinal musculature.  Osteopathic findings  C2 flexed rotated and side bent right C7 flexed rotated and side bent left T3 extended rotated and side bent right inhaled rib T7 extended rotated and side bent left L2 flexed rotated and side bent right Sacrum right on right       Assessment and Plan:  Cervical radiculopathy at C8 Patient does have some tightness noted.  We are going to continue to monitor the low back.  Has had intermittent radicular symptoms, worsening pain will consider the possibility of advanced imaging epidurals but hopefully not necessary.  Patient has medications including gabapentin for breakthrough pain.  Follow-up again in 6 to 8 weeks    Nonallopathic problems  Decision today to treat with OMT was based on Physical Exam  After verbal consent patient was treated with HVLA, ME, FPR techniques in cervical, rib, thoracic, lumbar, and sacral  areas  Patient tolerated the procedure well with improvement in symptoms  Patient given exercises, stretches and lifestyle modifications  See medications in patient instructions if given  Patient will follow up in 4-8 weeks     The above documentation has been reviewed and is accurate and complete Lyndal Pulley, DO         Note: This dictation was prepared with Dragon dictation along with smaller phrase technology. Any transcriptional errors that result from this process are unintentional.

## 2022-09-28 ENCOUNTER — Ambulatory Visit (INDEPENDENT_AMBULATORY_CARE_PROVIDER_SITE_OTHER): Payer: BC Managed Care – PPO | Admitting: Family Medicine

## 2022-09-28 VITALS — BP 122/80 | HR 72 | Ht 70.0 in | Wt 214.0 lb

## 2022-09-28 DIAGNOSIS — M9904 Segmental and somatic dysfunction of sacral region: Secondary | ICD-10-CM | POA: Diagnosis not present

## 2022-09-28 DIAGNOSIS — M9903 Segmental and somatic dysfunction of lumbar region: Secondary | ICD-10-CM

## 2022-09-28 DIAGNOSIS — M5412 Radiculopathy, cervical region: Secondary | ICD-10-CM | POA: Diagnosis not present

## 2022-09-28 DIAGNOSIS — M9908 Segmental and somatic dysfunction of rib cage: Secondary | ICD-10-CM | POA: Diagnosis not present

## 2022-09-28 DIAGNOSIS — M9901 Segmental and somatic dysfunction of cervical region: Secondary | ICD-10-CM | POA: Diagnosis not present

## 2022-09-28 DIAGNOSIS — M9902 Segmental and somatic dysfunction of thoracic region: Secondary | ICD-10-CM

## 2022-09-28 NOTE — Assessment & Plan Note (Signed)
Patient does have some tightness noted.  We are going to continue to monitor the low back.  Has had intermittent radicular symptoms, worsening pain will consider the possibility of advanced imaging epidurals but hopefully not necessary.  Patient has medications including gabapentin for breakthrough pain.  Follow-up again in 6 to 8 weeks

## 2022-11-06 ENCOUNTER — Ambulatory Visit: Payer: BC Managed Care – PPO | Admitting: Family Medicine

## 2023-03-05 NOTE — Progress Notes (Unsigned)
[facility] [address] [place] [address] Phone: [phone] Subjective:   Bruce Donath, am serving as a scribe for Dr. Antoine Primas.  I'm seeing this patient by the request  of:  Everrett Coombe, DO  CC: Neck and back pain follow-up  [id number]  Kjon Germani is a 42 y.o. male coming in with complaint of back and neck pain. OMT On 09/28/2022. Patient states that his lower left side of his back. Denies any radiating symptoms. Pain occurring for past month.  Taking:         Reviewed prior external information including notes and imaging from previsou exam, outside providers and external EMR if available.   As well as notes that were available from care everywhere and other healthcare systems.  Past medical history, social, surgical and family history all reviewed in electronic medical record.  No pertanent information unless stated regarding to the chief complaint.   Past Medical History:  Diagnosis Date   Sinus congestion     No Known Allergies   Review of Systems:  No headache, visual changes, nausea, vomiting, diarrhea, constipation, dizziness, abdominal pain, skin rash, fevers, chills, night sweats, weight loss, swollen lymph nodes, body aches, joint swelling, chest pain, shortness of breath, mood changes. POSITIVE muscle aches  Objective  Blood pressure (!) 122/92, pulse 65, height 5\' 10"  (1.778 m), weight 196 lb (88.9 kg), SpO2 98%.   General: No apparent distress alert and oriented x3 mood and affect normal, dressed appropriately.  HEENT: Pupils equal, extraocular movements intact  Respiratory: Patient's speak in full sentences and does not appear short of breath  Cardiovascular: No lower extremity edema, non tender, no erythema  Neck exam shows some mild loss lordosis but overall seems to be doing relatively well.  Negative Spurling's noted. Back: Tightness noted in the paraspinal musculature of the lumbar spine.  Some mild  pain with rotation more than anything else.  5/5 strength in lower extremities.  Osteopathic findings  C2 flexed rotated and side bent right C6 flexed rotated and side bent left T3 extended rotated and side bent right inhaled rib T9 extended rotated and side bent left L2 flexed rotated and side bent right L4 flexed rotated and side bent left L5 flexed rotated and side bent right Sacrum right on right       Assessment and Plan:  Low back pain Low back does have some loss lordosis noted.  Patient has had tightness noted.  Nothing that stopping him from activity at the moment.  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  Discussed which activities to do and which ones to avoid.  Increase activity slowly.  Follow-up again in 6 to 8 weeks    Nonallopathic problems  Decision today to treat with OMT was based on Physical Exam  After verbal consent patient was treated with HVLA, ME, FPR techniques in cervical, rib, thoracic, lumbar, and sacral  areas  Patient tolerated the procedure well with improvement in symptoms  Patient given exercises, stretches and lifestyle modifications  See medications in patient instructions if given  Patient will follow up in 4-8 weeks    The above documentation has been reviewed and is accurate and complete Judi Saa, DO          Note: This dictation was prepared with Dragon dictation along with smaller phrase technology. Any transcriptional errors that result from this process are unintentional.

## 2023-03-06 ENCOUNTER — Ambulatory Visit: Payer: BC Managed Care – PPO | Admitting: Family Medicine

## 2023-03-06 ENCOUNTER — Encounter: Payer: Self-pay | Admitting: Family Medicine

## 2023-03-06 VITALS — BP 122/92 | HR 65 | Ht 70.0 in | Wt 196.0 lb

## 2023-03-06 DIAGNOSIS — M9903 Segmental and somatic dysfunction of lumbar region: Secondary | ICD-10-CM

## 2023-03-06 DIAGNOSIS — M9904 Segmental and somatic dysfunction of sacral region: Secondary | ICD-10-CM

## 2023-03-06 DIAGNOSIS — M545 Low back pain, unspecified: Secondary | ICD-10-CM | POA: Diagnosis not present

## 2023-03-06 DIAGNOSIS — M9901 Segmental and somatic dysfunction of cervical region: Secondary | ICD-10-CM

## 2023-03-06 DIAGNOSIS — G8929 Other chronic pain: Secondary | ICD-10-CM | POA: Diagnosis not present

## 2023-03-06 DIAGNOSIS — M9908 Segmental and somatic dysfunction of rib cage: Secondary | ICD-10-CM

## 2023-03-06 DIAGNOSIS — M9902 Segmental and somatic dysfunction of thoracic region: Secondary | ICD-10-CM

## 2023-03-06 NOTE — Assessment & Plan Note (Signed)
Low back does have some loss lordosis noted.  Patient has had tightness noted.  Nothing that stopping him from activity at the moment.  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  Discussed which activities to do and which ones to avoid.  Increase activity slowly.  Follow-up again in 6 to 8 weeks

## 2023-04-29 NOTE — Progress Notes (Unsigned)
  [facility] [address] [place] [address] Phone: [phone] Subjective:   Bruce Donath, am serving as a scribe for Dr. Antoine Primas.  I'm seeing this patient by the request  of:  Everrett Coombe, DO  CC: Neck pain and low back pain follow-up  [id number]  Wayne [name] is a 42 y.o. male coming in with complaint of back and neck pain. OMT 03/06/2023. Patient states that he is doing well.  Patient has some tightness noted overall but nothing severe at this moment.  Patient did travel and feels like that caused a mild exacerbation with nothing severe.       Reviewed prior external information including notes and imaging from previsou exam, outside providers and external EMR if available.   As well as notes that were available from care everywhere and other healthcare systems.  Past medical history, social, surgical and family history all reviewed in electronic medical record.  No pertanent information unless stated regarding to the chief complaint.   Past Medical History:  Diagnosis Date   Sinus congestion     No Known Allergies   Review of Systems:  No headache, visual changes, nausea, vomiting, diarrhea, constipation, dizziness, abdominal pain, skin rash, fevers, chills, night sweats, weight loss, swollen lymph nodes, body aches, joint swelling, chest pain, shortness of breath, mood changes. POSITIVE muscle aches  Objective  Blood pressure (!) 122/92, height 5\' 10"  (1.778 m), weight 200 lb (90.7 kg), SpO2 98%.   General: No apparent distress alert and oriented x3 mood and affect normal, dressed appropriately.  HEENT: Pupils equal, extraocular movements intact  Respiratory: Patient's speak in full sentences and does not appear short of breath  Cardiovascular: No lower extremity edema, non tender, no erythema  Gait MSK:  Back low back does have some loss of lordosis noted.  Some tenderness to palpation.  More pain actually on the right  side of the neck and the right scapular area.  Osteopathic findings  C2 flexed rotated and side bent right C7 flexed rotated and side bent right T3 extended rotated and side bent right inhaled rib T8 extended rotated and side bent left L2 flexed rotated and side bent right Sacrum right on right    Assessment and Plan:  Cervical radiculopathy at C8 Discussed home exercises and icing regimen.  Still working on [employer].  Does get more tightness with sitting at this time a year.  Increase activity slowly otherwise.  Follow-up with me again in 6 to 8 weeks.    Nonallopathic problems  Decision today to treat with OMT was based on Physical Exam  After verbal consent patient was treated with HVLA, ME, FPR techniques in cervical, rib, thoracic, lumbar, and sacral  areas  Patient tolerated the procedure well with improvement in symptoms  Patient given exercises, stretches and lifestyle modifications  See medications in patient instructions if given  Patient will follow up in 4-8 weeks    The above documentation has been reviewed and is accurate and complete Judi Saa, DO          Note: This dictation was prepared with Dragon dictation along with smaller phrase technology. Any transcriptional errors that result from this process are unintentional.

## 2023-04-30 ENCOUNTER — Encounter: Payer: Self-pay | Admitting: Family Medicine

## 2023-04-30 ENCOUNTER — Ambulatory Visit: Payer: BC Managed Care – PPO | Admitting: Family Medicine

## 2023-04-30 VITALS — BP 122/92 | Ht 70.0 in | Wt 200.0 lb

## 2023-04-30 DIAGNOSIS — M5412 Radiculopathy, cervical region: Secondary | ICD-10-CM | POA: Diagnosis not present

## 2023-04-30 DIAGNOSIS — M9902 Segmental and somatic dysfunction of thoracic region: Secondary | ICD-10-CM | POA: Diagnosis not present

## 2023-04-30 DIAGNOSIS — M9904 Segmental and somatic dysfunction of sacral region: Secondary | ICD-10-CM | POA: Diagnosis not present

## 2023-04-30 DIAGNOSIS — M9903 Segmental and somatic dysfunction of lumbar region: Secondary | ICD-10-CM

## 2023-04-30 DIAGNOSIS — M9908 Segmental and somatic dysfunction of rib cage: Secondary | ICD-10-CM

## 2023-04-30 DIAGNOSIS — M9901 Segmental and somatic dysfunction of cervical region: Secondary | ICD-10-CM

## 2023-04-30 NOTE — Assessment & Plan Note (Signed)
Discussed home exercises and icing regimen.  Still working on [employer].  Does get more tightness with sitting at this time a year.  Increase activity slowly otherwise.  Follow-up with me again in 6 to 8 weeks.

## 2023-05-23 ENCOUNTER — Encounter: Payer: Self-pay | Admitting: Family Medicine

## 2023-05-23 ENCOUNTER — Ambulatory Visit (INDEPENDENT_AMBULATORY_CARE_PROVIDER_SITE_OTHER): Payer: BC Managed Care – PPO | Admitting: Family Medicine

## 2023-05-23 VITALS — BP 151/96 | HR 76 | Ht 70.0 in | Wt 202.0 lb

## 2023-05-23 DIAGNOSIS — Z13220 Encounter for screening for lipoid disorders: Secondary | ICD-10-CM

## 2023-05-23 DIAGNOSIS — Z0000 Encounter for general adult medical examination without abnormal findings: Secondary | ICD-10-CM | POA: Diagnosis not present

## 2023-05-23 DIAGNOSIS — J343 Hypertrophy of nasal turbinates: Secondary | ICD-10-CM | POA: Diagnosis not present

## 2023-05-23 MED ORDER — MUPIROCIN 2 % EX OINT: 1.0000 | TOPICAL_OINTMENT | Freq: Two times a day (BID) | CUTANEOUS | 0 refills | Status: AC

## 2023-05-23 NOTE — Assessment & Plan Note (Signed)
Well adult Orders Placed This Encounter  Procedures   CMP14+EGFR   CBC with Differential/Platelet   Lipid Panel With LDL/HDL Ratio  Screenings: Per lab orders.  Additional labs ordered due to fatigue. Immunizations: Up-to-date Anticipatory guidance/risk factor reduction: Recommendations per AVS.

## 2023-05-23 NOTE — Patient Instructions (Signed)

## 2023-05-23 NOTE — Progress Notes (Signed)
Teddy Heinsohn - 42 y.o. male MRN [id number]  Date of birth: 03-08-1981  Subjective Chief Complaint  Patient presents with   Annual Exam    HPI Terran Lawrie is a 42 y.o. male here today for annual exam.    He reports that he is doing well.  He has discontinued testosterone. Has had some L sided nasal congestion.  He is planning to see ENT.  He does note some pain when pressing on the nostril.   He remains fairly active.  He feels that diet is pretty Tout.   He is a non-smoker.  Moderate EtOH use during the week.   Review of Systems  Constitutional:  Negative for chills, fever, malaise/fatigue and weight loss.  HENT:  Negative for congestion, ear pain and sore throat.   Eyes:  Negative for blurred vision, double vision and pain.  Respiratory:  Negative for cough and shortness of breath.   Cardiovascular:  Negative for chest pain and palpitations.  Gastrointestinal:  Negative for abdominal pain, blood in stool, constipation, heartburn and nausea.  Genitourinary:  Negative for dysuria and urgency.  Musculoskeletal:  Negative for joint pain and myalgias.  Neurological:  Negative for dizziness and headaches.  Endo/Heme/Allergies:  Does not bruise/bleed easily.  Psychiatric/Behavioral:  Negative for depression. The patient is not nervous/anxious and does not have insomnia.     No Known Allergies  Past Medical History:  Diagnosis Date   Sinus congestion     Past Surgical History:  Procedure Laterality Date   APPENDECTOMY     NASAL SINUS SURGERY      Social History   Socioeconomic History   Marital status: Married    Spouse name: Raanan Augustyniak   Number of children: Not on file   Years of education: Not on file   Highest education level: Some college, no degree  Occupational History   Occupation: Production designer, theatre/television/film  Tobacco Use   Smoking status: Never    Passive exposure: Never   Smokeless tobacco: Never  Vaping Use   Vaping status: Never Used  Substance and Sexual Activity   Alcohol  use: Yes    Alcohol/week: 10.0 standard drinks of alcohol    Types: 10 Standard drinks or equivalent per week    Comment: liquor most days   Drug use: Never   Sexual activity: Yes    Partners: Female  Other Topics Concern   Not on file  Social History Narrative   Not on file   Social Determinants of Health   Financial Resource Strain: Medium Risk (05/22/2023)   Overall Financial Resource Strain (CARDIA)    Difficulty of Paying Living Expenses: Somewhat hard  Food Insecurity: No Food Insecurity (05/22/2023)   Hunger Vital Sign    Worried About Running Out of Food in the Last Year: Never true    Ran Out of Food in the Last Year: Never true  Transportation Needs: No Transportation Needs (05/22/2023)   PRAPARE - Administrator, Civil Service (Medical): No    Lack of Transportation (Non-Medical): No  Physical Activity: Insufficiently Active (05/22/2023)   Exercise Vital Sign    Days of Exercise per Week: 1 day    Minutes of Exercise per Session: 20 min  Stress: Stress Concern Present (05/22/2023)   [employer] of Occupational Health - Occupational Stress Questionnaire    Feeling of Stress : To some extent  Social Connections: Moderately Isolated (05/22/2023)   Social Connection and Isolation Panel [NHANES]    Frequency of Communication with  Friends and Family: More than three times a week    Frequency of Social Gatherings with Friends and Family: Once a week    Attends Religious Services: Never    Database administrator or Organizations: No    Attends Engineer, structural: Not on file    Marital Status: Married    Family History  Problem Relation Age of Onset   Hypertension Father    Diabetes Father     Health Maintenance  Topic Date Due   HIV Screening  Never done   Hepatitis C Screening  Never done   COVID-19 Vaccine (1 - 2023-24 season) 06/08/2023 (Originally 03/24/2023)   INFLUENZA VACCINE  10/21/2023 (Originally 02/21/2023)   DTaP/Tdap/Td (2  - Td or Tdap) 09/28/2024   HPV VACCINES  Aged Out     ----------------------------------------------------------------------------------------------------------------------------------------------------------------------------------------------------------------- Physical Exam BP (!) 151/96 (BP Location: Left Arm, Patient Position: Sitting, Cuff Size: Normal)   Pulse 76   Ht 5\' 10"  (1.778 m)   Wt 202 lb (91.6 kg)   SpO2 98%   BMI 28.98 kg/m   Physical Exam Constitutional:      General: He is not in acute distress. HENT:     Head: Normocephalic and atraumatic.     Right Ear: Tympanic membrane and external ear normal.     Left Ear: Tympanic membrane and external ear normal.     Nose:     Comments: L turbinate inflamed and erythematous.  Eyes:     General: No scleral icterus. Neck:     Thyroid: No thyromegaly.  Cardiovascular:     Rate and Rhythm: Normal rate and regular rhythm.     Heart sounds: Normal heart sounds.  Pulmonary:     Effort: Pulmonary effort is normal.     Breath sounds: Normal breath sounds.  Abdominal:     General: Bowel sounds are normal. There is no distension.     Palpations: Abdomen is soft.     Tenderness: There is no abdominal tenderness. There is no guarding.  Musculoskeletal:     Cervical back: Normal range of motion.  Lymphadenopathy:     Cervical: No cervical adenopathy.  Skin:    General: Skin is warm and dry.     Findings: No rash.  Neurological:     Mental Status: He is alert and oriented to person, place, and time.     Cranial Nerves: No cranial nerve deficit.     Motor: No abnormal muscle tone.  Psychiatric:        Mood and Affect: Mood normal.        Behavior: Behavior normal.     ------------------------------------------------------------------------------------------------------------------------------------------------------------------------------------------------------------------- Assessment and Plan  Well adult  exam Well adult Orders Placed This Encounter  Procedures   CMP14+EGFR   CBC with Differential/Platelet   Lipid Panel With LDL/HDL Ratio  Screenings: Per lab orders.  Additional labs ordered due to fatigue. Immunizations: Up-to-date Anticipatory guidance/risk factor reduction: Recommendations per AVS.  Hypertrophy of inferior nasal turbinate Adding flonase and mupirocin ointment. Has appt with ENT.    Meds ordered this encounter  Medications   mupirocin ointment (BACTROBAN) 2 %    Sig: Apply 1 Application topically 2 (two) times daily for 7 days.    Dispense:  22 g    Refill:  0    No follow-ups on file.    This visit occurred during the SARS-CoV-2 public health emergency.  Safety protocols were in place, including screening questions prior to the visit, additional usage of  staff PPE, and extensive cleaning of exam room while observing appropriate contact time as indicated for disinfecting solutions.

## 2023-05-23 NOTE — Assessment & Plan Note (Signed)
Adding flonase and mupirocin ointment. Has appt with ENT.

## 2023-06-13 NOTE — Progress Notes (Signed)
  [facility] [address] [place] [address] Phone: [phone] Subjective:   Wayne [name], am serving as a scribe for Dr. Antoine Primas.  I'm seeing this patient by the request  of:  Everrett Coombe, DO  CC: back and neck pain follow up   BJY:[id number]  Wayne [name] is a 42 y.o. male coming in with complaint of back and neck pain. OMT 04/30/2023. Patient states that he is [name] . Here for an adjustment   Medications patient has been prescribed: None  Taking:         Reviewed prior external information including notes and imaging from previsou exam, outside providers and external EMR if available.   As well as notes that were available from care everywhere and other healthcare systems.  Past medical history, social, surgical and family history all reviewed in electronic medical record.  No pertanent information unless stated regarding to the chief complaint.   Past Medical History:  Diagnosis Date   Sinus congestion     No Known Allergies   Review of Systems:  No headache, visual changes, nausea, vomiting, diarrhea, constipation, dizziness, abdominal pain, skin rash, fevers, chills, night sweats, weight loss, swollen lymph nodes, body aches, joint swelling, chest pain, shortness of breath, mood changes. POSITIVE muscle aches  Objective  Blood pressure 118/82, pulse 74, height 5\' 10"  (1.778 m), weight 200 lb (90.7 kg), SpO2 99%.   General: No apparent distress alert and oriented x3 mood and affect normal, dressed appropriately.  HEENT: Pupils equal, extraocular movements intact  Respiratory: Patient's speak in full sentences and does not appear short of breath  Cardiovascular: No lower extremity edema, non tender, no erythema  Gait MSK:  Back does have loss of lordosis tightness faber    Osteopathic findings  C3 flexed rotated and side bent right C5 flexed rotated and side bent right  T3 extended rotated and side bent right  inhaled rib T9 extended rotated and side bent left L2 flexed rotated and side bent right L5 F RS left  Sacrum right on right    Assessment and Plan:  No problem-specific Assessment & Plan notes found for this encounter.    Nonallopathic problems  Decision today to treat with OMT was based on Physical Exam  After verbal consent patient was treated with HVLA, ME, FPR techniques in cervical, rib, thoracic, lumbar, and sacral  areas  Patient tolerated the procedure well with improvement in symptoms  Patient given exercises, stretches and lifestyle modifications  See medications in patient instructions if given  Patient will follow up in 4-8 weeks    The above documentation has been reviewed and is accurate and complete Wayne Saa, DO          Note: This dictation was prepared with Dragon dictation along with smaller phrase technology. Any transcriptional errors that result from this process are unintentional.

## 2023-06-24 ENCOUNTER — Encounter: Payer: Self-pay | Admitting: Family Medicine

## 2023-06-24 ENCOUNTER — Ambulatory Visit: Payer: BC Managed Care – PPO | Admitting: Family Medicine

## 2023-06-24 VITALS — BP 118/82 | HR 74 | Ht 70.0 in | Wt 200.0 lb

## 2023-06-24 DIAGNOSIS — M9901 Segmental and somatic dysfunction of cervical region: Secondary | ICD-10-CM

## 2023-06-24 DIAGNOSIS — G2589 Other specified extrapyramidal and movement disorders: Secondary | ICD-10-CM | POA: Diagnosis not present

## 2023-06-24 DIAGNOSIS — M9903 Segmental and somatic dysfunction of lumbar region: Secondary | ICD-10-CM

## 2023-06-24 DIAGNOSIS — M9902 Segmental and somatic dysfunction of thoracic region: Secondary | ICD-10-CM | POA: Diagnosis not present

## 2023-06-24 DIAGNOSIS — M9904 Segmental and somatic dysfunction of sacral region: Secondary | ICD-10-CM

## 2023-06-24 DIAGNOSIS — M9908 Segmental and somatic dysfunction of rib cage: Secondary | ICD-10-CM | POA: Diagnosis not present

## 2023-06-24 NOTE — Assessment & Plan Note (Signed)
Continue to increase  Discussed HEP  Discussed which activities to do and which ones to avoid.  Increase activity slowly  RTC in 6-8 weeks

## 2023-08-22 NOTE — Progress Notes (Deleted)
  [facility] [address] [place] [address] Phone: [phone] Subjective:    I'm seeing this patient by the request  of:  Alvia Bring, DO  CC:   YEP:Dlagzrupcz  Wayne [name] is a 43 y.o. male coming in with complaint of back and neck pain. OMT 06/24/2023.  Patient states   Medications patient has been prescribed: None  Taking:         Reviewed prior external information including notes and imaging from previsou exam, outside providers and external EMR if available.   As well as notes that were available from care everywhere and other healthcare systems.  Past medical history, social, surgical and family history all reviewed in electronic medical record.  No pertanent information unless stated regarding to the chief complaint.   Past Medical History:  Diagnosis Date   Sinus congestion     No Known Allergies   Review of Systems:  No headache, visual changes, nausea, vomiting, diarrhea, constipation, dizziness, abdominal pain, skin rash, fevers, chills, night sweats, weight loss, swollen lymph nodes, body aches, joint swelling, chest pain, shortness of breath, mood changes. POSITIVE muscle aches  Objective  There were no vitals taken for this visit.   General: No apparent distress alert and oriented x3 mood and affect normal, dressed appropriately.  HEENT: Pupils equal, extraocular movements intact  Respiratory: Patient's speak in full sentences and does not appear short of breath  Cardiovascular: No lower extremity edema, non tender, no erythema  Gait MSK:  Back   Osteopathic findings  C2 flexed rotated and side bent right C6 flexed rotated and side bent left T3 extended rotated and side bent right inhaled rib T9 extended rotated and side bent left L2 flexed rotated and side bent right Sacrum right on right       Assessment and Plan:  No problem-specific Assessment & Plan notes found for this encounter.     Nonallopathic problems  Decision today to treat with OMT was based on Physical Exam  After verbal consent patient was treated with HVLA, ME, FPR techniques in cervical, rib, thoracic, lumbar, and sacral  areas  Patient tolerated the procedure well with improvement in symptoms  Patient given exercises, stretches and lifestyle modifications  See medications in patient instructions if given  Patient will follow up in 4-8 weeks             Note: This dictation was prepared with Dragon dictation along with smaller phrase technology. Any transcriptional errors that result from this process are unintentional.

## 2023-08-28 ENCOUNTER — Ambulatory Visit: Payer: BC Managed Care – PPO | Admitting: Family Medicine

## 2023-11-27 ENCOUNTER — Encounter: Payer: Self-pay | Admitting: Family Medicine

## 2023-11-27 ENCOUNTER — Ambulatory Visit (INDEPENDENT_AMBULATORY_CARE_PROVIDER_SITE_OTHER): Admitting: Family Medicine

## 2023-11-27 ENCOUNTER — Ambulatory Visit: Payer: Self-pay

## 2023-11-27 VITALS — BP 128/79 | HR 71 | Ht 70.0 in | Wt 200.0 lb

## 2023-11-27 DIAGNOSIS — R1311 Dysphagia, oral phase: Secondary | ICD-10-CM | POA: Diagnosis not present

## 2023-11-27 MED ORDER — PREDNISONE 20 MG PO TABS: 40.0000 mg | ORAL_TABLET | Freq: Every day | ORAL | 0 refills | Status: DC

## 2023-11-27 NOTE — Progress Notes (Signed)
 Acute Office Visit  Subjective:     Patient ID: Wayne [name], male    DOB: 06/25/1981, 43 y.o.   MRN: [id number]  Chief Complaint  Patient presents with   GLOBUS SENSATION    Pt reports that he has had a Globus sensation that has been on/off for years however, it got worse last Monday. He stated that he has seen Dr. Augustus Ledger about this and he has had nasal polyps removed years ago. He has been using nasal spray to help with this but stated that he stopped using this in march but restarted this morning and for the past 2 days he has been taking Zyrtec(dried out). He stated that he doesn't feel blocked but just feels that he's swallowing thick mucus, and has a constant drip    HPI Patient is in today for sensation of difficulty swallowing.  He feels like it is occurring right at the back of the throat just past the tongue.  He says it feels most like there is mucus stuck most like there is a flap moving back-and-forth and sometimes is just really difficult to relieve the sensation he has to swallow really hard almost like there is oatmeal stuck back there.  He does have a long sinus history back in December 2020 he was having a lot of nasal issues and congestion and did consult with the ENT.  He says he was so swollen that they were not unable to take a look with the scope and had to put him on prednisone  and then he had to return.  Then last year he went back to ENT and did another evaluation and was told that he had postnasal drip.  He has had a prior history of nasal polyps.  He was just having a lot of nasal bleeding and dryness at that time.  He has had some seasonal allergy symptoms that seem to be worse this year.  He uses the vitamin C Hall's to help soothe his throat.  But he says sometimes it just feels like it so difficult to swallow that he almost gags.  Actually been worse the last 2 days.  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  ROS      Objective:    BP 128/79   Pulse 71   Ht 5\' 10"  (1.778 m)   Wt 200 lb (90.7 kg)   SpO2 100%   BMI 28.70 kg/m    Physical Exam Constitutional:      Appearance: Normal appearance.  HENT:     Head: Normocephalic and atraumatic.     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    Right Ear: Tympanic membrane, ear canal and external ear normal. There is no impacted cerumen.     Left Ear: Tympanic membrane, ear canal and external ear normal. There is no impacted cerumen.     Nose: Nose normal.     Mouth/Throat:     Pharynx: Oropharynx is clear.  Eyes:     Conjunctiva/sclera: Conjunctivae normal.  Cardiovascular:     Rate and Rhythm: Normal rate and regular rhythm.  Pulmonary:     Effort: Pulmonary effort is normal.     Breath sounds: Normal breath sounds.  Musculoskeletal:     Cervical back: Neck supple. No tenderness.  Lymphadenopathy:  Cervical: No cervical adenopathy.  Skin:    General: Skin is warm and dry.  Neurological:     Mental Status: He is alert and oriented to person, place, and time.  Psychiatric:        Mood and Affect: Mood normal.     No results found for any visits on 11/27/23.      Assessment & Plan:   Problem List Items Addressed This Visit       Digestive   Oral phase dysphagia - Primary  He says it is not really with eating that he is having difficulty can happen when he has not eaten for several hours.  We discussed possibilities including postnasal drip and drainage that is thick enough that it is sticking to the back of the throat versus GERD which can also cause an increase in mucus production versus some true swelling in that area.  We discussed doing a 5-day trial of prednisone  and starting a nasal steroid spray such as fluticasone  or or Nasonex.  Will also do a 2-week trial of medication for GERD.  If over the 2 weeks he gradually feels  better then he can stop the PPI and then gradually wean off of the nasal spray.  If symptoms persist then consider CT neck and/or ENT referral for further workup.  Also discussed that it could also just be a sensation and there may not actually be an obstruction there.  Meds ordered this encounter  Medications   predniSONE  (DELTASONE ) 20 MG tablet    Sig: Take 2 tablets (40 mg total) by mouth daily with breakfast.    Dispense:  10 tablet    Refill:  0    No follow-ups on file.  Duaine German, MD

## 2023-11-27 NOTE — Telephone Encounter (Signed)
 Copied from CRM [id number]. Topic: Clinical - Red Word Triage >> Nov 27, 2023  8:08 AM Danelle Dunning F wrote: [facility] that prompted transfer to Nurse Triage:  Having trouble swallowing when he is not drinking or eating  Chief Complaint: trouble swallowing Symptoms: intermittent brief trouble swallowing spit, potential panic, "feels like lump in throat,"  Frequency: continual, intermittent Pertinent Negatives: Patient denies trouble swallowing food or liquids, any pain, chest pain, SOB, food/pill stuck, swollen tongue, mouth sores, drooling/spitting more than usual Disposition: [] 911 / [] ED /[] Urgent Care (no appt availability in office) / [x] Appointment(In office/virtual)/ []  [facility]/ [] Home Care/ [] Refused Recommended Disposition /[]  Mobile Bus/ []  Follow-up with PCP Additional Notes: Pt reporting that starting Monday he started having trouble swallowing for the first time, only difficulty with swallowing spit, confirms no issues swallowing food or water/fluids. Pt reporting he feels like "swallowing a ball" or like swallowing food that's not been fully chewed, lasts 2-3 min at a time or even 30 seconds, relieved immediately once either drinks water or finally completes a swallow of his saliva. Pt confirms feeling is not present now, confirms no pain at all, no event with food/pill swallowing. Pt reporting that he had a time of panic yesterday, "spasming out" "felt like was panicking because couldn't swallow but also could be a muscle spasm there." Pt reporting that he found sticking his finger into his collarbone could "push in" the feeling and make it easier to swallow. Pt is breathing, talking, and reports drinking well, over phone. Advised pt be examined in next 4 hours, scheduled with PCP office for this am, advised ED if any worsening. Pt verbalized understanding.  Reason for Disposition  [1] Symptoms of pill stuck in throat or esophagus (e.g., pain in throat or chest, FB  sensation) AND [2] no relief after using Care Advice  Answer Assessment - Initial Assessment Questions 1. DESCRIPTION: "Tell me more about this problem." "Are you  having trouble swallowing liquids, solids, or both?" "Any trouble with swallowing saliva (spit)?"     Trouble swallowing spit, ever since had covid Dec 2021 have had more postnasal drip, gone to ENT multiple times, feels like swallowing a ball most of the time, normally eat vitamin C Halls because helps but yesterday so much saliva had trouble swallowing had spasms Don't know if signs of panic attack don't feel like I'm that stressed When blow my nose seems dry, don't know if just going straight but not much coming back down throat Feels like lump in throat 2. SEVERITY: "How bad is the swallowing difficulty?"  (e.g., Scale 1-10; or mild, moderate, severe)   - MILD (0-3): Occasional swallowing difficulty; has trouble swallowing certain types of foods or liquids.   - MODERATE (4-7): Frequent swallowing difficulty; only able to swallow small amounts of foods and fluids.   - SEVERE (8-10): Unable to swallow any foods, fluids, or saliva; sensation of "lump in throat" or "something stuck in throat", and frequent drooling or spitting may be present.     Lump in throat, trouble swallowing spit but no issues with food and liquid 3. ONSET: "When did the swallowing problems begin?"      Since Monday afternoon 4. CAUSE: "What do you think is causing the problem?"  (e.g., dry mouth, food or pill stuck in throat, mouth pain, sore throat, progression of disease process such as dementia or Parkinson's disease).      Could be seasonal allergies worse this year, could be me overreacting 5. CHRONIC  or RECURRENT: "Is this a new problem for you?"  If No, ask: "How long have you had this problem?" (e.g., days, weeks, months)      new 6. OTHER SYMPTOMS: "Do you have any other symptoms?" (e.g., chest pain, difficulty breathing, mouth sores, sore throat,  swollen tongue, chest pain)     No Yesterday when spasming out could stick finger into collarbone and push in, goes down like normal and makes it easier to swallow Feel like swallowing something didn't chew all the way Water goes right down easy But couldn't swallow the spit Felt like was panicking because couldn't swallow but also could be a muscle spasm there Not present now Not constant all the time, lasts 2-3 min at a time maybe just 30 sec Drinking coffee at present As soon as get swallow either water or finally get the spit down then goes away Not spitting or drooling more than usual  Protocols used: Swallowing Difficulty-A-AH

## 2023-11-27 NOTE — Telephone Encounter (Signed)
 FYI - Patient is scheduled to see provider today.

## 2023-11-27 NOTE — Patient Instructions (Signed)
 Recommend Prilosec, Nexium or Prevacid daily, Take about 20-30 min before your first meal of the day.    Take the prednisone  for 5 days with food and water.    Start flonase  or nasonex daily.    If not better in 2 weeks please let me know.

## 2024-05-06 NOTE — Progress Notes (Unsigned)
 [name] [address] [place] [address] Phone: [phone] Subjective:   Wayne [name], am serving as a scribe for Dr. Arthea Claudene.  I'm seeing this patient by the request  of:  Wayne Bring, DO  CC: Back and neck pain follow-up  YEP:Dlagzrupcz  Wayne [name] is a 43 y.o. male coming in with complaint of back and neck pain. OMT December 2024. Patient states that he is having pain in R scapula, R lat, R trap. Has tingling in R arm. No injury to this area. Worse when mousing.   Medications patient has been prescribed: None  Taking:         Reviewed prior external information including notes and imaging from previsou exam, outside providers and external EMR if available.   As well as notes that were available from care everywhere and other healthcare systems.  Past medical history, social, surgical and family history all reviewed in electronic medical record.  No pertanent information unless stated regarding to the chief complaint.   Past Medical History:  Diagnosis Date   Sinus congestion     No Known Allergies   Review of Systems:  No headache, visual changes, nausea, vomiting, diarrhea, constipation, dizziness, abdominal pain, skin rash, fevers, chills, night sweats, weight loss, swollen lymph nodes, body aches, joint swelling, chest pain, shortness of breath, mood changes. POSITIVE muscle aches  Objective  Blood pressure (!) 130/92, pulse 75, height 5' 10 (1.778 m), weight 197 lb (89.4 kg).   General: No apparent distress alert and oriented x3 mood and affect normal, dressed appropriately.  HEENT: Pupils equal, extraocular movements intact  Respiratory: Patient's speak in full sentences and does not appear short of breath  Cardiovascular: No lower extremity edema, non tender, no erythema  MSK:  Back does have some loss of lordosis.  Neck exam does have some limited sidebending noted.  Tightness noted patient neck negative  Spurling's but tightness with sidebending on the right side.  Osteopathic findings  C2 flexed rotated and side bent right C7 flexed rotated and side bent left T3 extended rotated and side bent right inhaled rib T9 extended rotated and side bent left L2 flexed rotated and side bent right L3 flexed rotated and side bent left Sacrum right on right       Assessment and Plan:  Scapular dyskinesis Scapular dyskinesis still noted.  No true signs of cervical radiculopathy but it was concerning at the moment with patient having radicular symptoms in the C7 aspect but a negative Spurling's today.  Discussed icing regimen and home exercises, discussed which activities to do and which ones to avoid.  Increase activity slowly.  Follow-up with me again 6 to 8 weeks or he will call us  sooner if worsening radicular symptoms or weakness    Nonallopathic problems  Decision today to treat with OMT was based on Physical Exam  After verbal consent patient was treated with HVLA, ME, FPR techniques in cervical, rib, thoracic, lumbar, and sacral  areas  Patient tolerated the procedure well with improvement in symptoms  Patient given exercises, stretches and lifestyle modifications  See medications in patient instructions if given  Patient will follow up in 4-8 weeks     The above documentation has been reviewed and is accurate and complete Arthea CHRISTELLA Claudene, DO         Note: This dictation was prepared with Dragon dictation along with smaller phrase technology. Any transcriptional errors that result from this process are unintentional.

## 2024-05-07 ENCOUNTER — Encounter: Payer: Self-pay | Admitting: Family Medicine

## 2024-05-07 ENCOUNTER — Ambulatory Visit: Admitting: Family Medicine

## 2024-05-07 VITALS — BP 130/92 | HR 75 | Ht 70.0 in | Wt 197.0 lb

## 2024-05-07 DIAGNOSIS — M9901 Segmental and somatic dysfunction of cervical region: Secondary | ICD-10-CM

## 2024-05-07 DIAGNOSIS — M9904 Segmental and somatic dysfunction of sacral region: Secondary | ICD-10-CM | POA: Diagnosis not present

## 2024-05-07 DIAGNOSIS — G2589 Other specified extrapyramidal and movement disorders: Secondary | ICD-10-CM

## 2024-05-07 DIAGNOSIS — M9902 Segmental and somatic dysfunction of thoracic region: Secondary | ICD-10-CM

## 2024-05-07 DIAGNOSIS — M9908 Segmental and somatic dysfunction of rib cage: Secondary | ICD-10-CM

## 2024-05-07 DIAGNOSIS — M9903 Segmental and somatic dysfunction of lumbar region: Secondary | ICD-10-CM

## 2024-05-07 MED ORDER — PREDNISONE 20 MG PO TABS: 20.0000 mg | ORAL_TABLET | Freq: Every day | ORAL | 0 refills | Status: DC

## 2024-05-07 NOTE — Patient Instructions (Signed)
 Prednisone for 5 days.

## 2024-05-07 NOTE — Assessment & Plan Note (Signed)
 Scapular dyskinesis still noted.  No true signs of cervical radiculopathy but it was concerning at the moment with patient having radicular symptoms in the C7 aspect but a negative Spurling's today.  Discussed icing regimen and home exercises, discussed which activities to do and which ones to avoid.  Increase activity slowly.  Follow-up with me again 6 to 8 weeks or he will call us  sooner if worsening radicular symptoms or weakness

## 2024-06-25 ENCOUNTER — Encounter: Payer: Self-pay | Admitting: Family Medicine

## 2024-06-25 ENCOUNTER — Ambulatory Visit (INDEPENDENT_AMBULATORY_CARE_PROVIDER_SITE_OTHER): Admitting: Family Medicine

## 2024-06-25 VITALS — BP 165/116 | HR 73 | Ht 70.5 in | Wt 195.0 lb

## 2024-06-25 DIAGNOSIS — Z13220 Encounter for screening for lipoid disorders: Secondary | ICD-10-CM

## 2024-06-25 DIAGNOSIS — Z0000 Encounter for general adult medical examination without abnormal findings: Secondary | ICD-10-CM

## 2024-06-25 DIAGNOSIS — R030 Elevated blood-pressure reading, without diagnosis of hypertension: Secondary | ICD-10-CM | POA: Diagnosis not present

## 2024-06-25 DIAGNOSIS — R7989 Other specified abnormal findings of blood chemistry: Secondary | ICD-10-CM

## 2024-06-25 NOTE — Assessment & Plan Note (Addendum)
 Low sodium diet.  Add in exercise.  Return in 2 weeks for BP recheck.  Encouraged to check at home.

## 2024-06-25 NOTE — Assessment & Plan Note (Signed)
 Well adult Orders Placed This Encounter  Procedures   CMP14+EGFR   CBC with Differential/Platelet   Lipid Panel With LDL/HDL Ratio   PSA   Testosterone    Vitamin D (25 hydroxy)   TSH  Screenings: Per lab orders.   Immunizations: Up-to-date Anticipatory guidance/risk factor reduction: Recommendations per AVS.

## 2024-06-25 NOTE — Progress Notes (Signed)
 Wayne [name] - 43 y.o. male MRN [id number]  Date of birth: 19-Jul-1981  Subjective No chief complaint on file.   HPI Wayne [name] is a 43 y.o. male here today for annual exam.   He reports that he is doing ok.  Admits to increased stress.  His company was recently    He remains moderately active.  He feels that he is doing well with diet.   BP is elevated today on initial check. Denies symptoms related to this.   He is a non-smoker. 1-2 drinks of EtOH each day.   Review of Systems  Constitutional:  Negative for chills, fever, malaise/fatigue and weight loss.  HENT:  Negative for congestion, ear pain and sore throat.   Eyes:  Negative for blurred vision, double vision and pain.  Respiratory:  Negative for cough and shortness of breath.   Cardiovascular:  Negative for chest pain and palpitations.  Gastrointestinal:  Negative for abdominal pain, blood in stool, constipation, heartburn and nausea.  Genitourinary:  Negative for dysuria and urgency.  Musculoskeletal:  Negative for joint pain and myalgias.  Neurological:  Negative for dizziness and headaches.  Endo/Heme/Allergies:  Does not bruise/bleed easily.  Psychiatric/Behavioral:  Negative for depression. The patient is not nervous/anxious and does not have insomnia.     No Known Allergies  Past Medical History:  Diagnosis Date   Sinus congestion     Past Surgical History:  Procedure Laterality Date   APPENDECTOMY     NASAL SINUS SURGERY      Social History   Socioeconomic History   Marital status: Married    Spouse name: Orris Perin   Number of children: Not on file   Years of education: Not on file   Highest education level: Some college, no degree  Occupational History   Occupation: Production Designer, Theatre/television/film  Tobacco Use   Smoking status: Never    Passive exposure: Never   Smokeless tobacco: Never  Vaping Use   Vaping status: Never Used  Substance and Sexual Activity   Alcohol use: Yes    Alcohol/week: 10.0 standard drinks of  alcohol    Types: 10 Standard drinks or equivalent per week    Comment: liquor most days   Drug use: Never   Sexual activity: Yes    Partners: Female  Other Topics Concern   Not on file  Social History Narrative   Not on file   Social Drivers of Health   Financial Resource Strain: Medium Risk (05/22/2023)   Overall Financial Resource Strain (CARDIA)    Difficulty of Paying Living Expenses: Somewhat hard  Food Insecurity: No Food Insecurity (05/22/2023)   Hunger Vital Sign    Worried About Running Out of Food in the Last Year: Never true    Ran Out of Food in the Last Year: Never true  Transportation Needs: No Transportation Needs (05/22/2023)   PRAPARE - Administrator, Civil Service (Medical): No    Lack of Transportation (Non-Medical): No  Physical Activity: Insufficiently Active (05/22/2023)   Exercise Vital Sign    Days of Exercise per Week: 1 day    Minutes of Exercise per Session: 20 min  Stress: Stress Concern Present (05/22/2023)   [employer] of Occupational Health - Occupational Stress Questionnaire    Feeling of Stress : To some extent  Social Connections: Moderately Isolated (05/22/2023)   Social Connection and Isolation Panel    Frequency of Communication with Friends and Family: More than three times a week  Frequency of Social Gatherings with Friends and Family: Once a week    Attends Religious Services: Never    Database Administrator or Organizations: No    Attends Engineer, Structural: Not on file    Marital Status: Married    Family History  Problem Relation Age of Onset   Hypertension Father    Diabetes Father     Health Maintenance  Topic Date Due   HIV Screening  Never done   Hepatitis C Screening  Never done   Hepatitis B Vaccines 19-59 Average Risk (1 of 3 - 19+ 3-dose series) Never done   HPV VACCINES (1 - 3-dose SCDM series) Never done   COVID-19 Vaccine (1 - 2025-26 season) Never done   Influenza Vaccine   10/20/2024 (Originally 02/21/2024)   DTaP/Tdap/Td (2 - Td or Tdap) 09/28/2024   Pneumococcal Vaccine  Aged Out   Meningococcal B Vaccine  Aged Out     ----------------------------------------------------------------------------------------------------------------------------------------------------------------------------------------------------------------- Physical Exam BP (!) 162/100   Pulse 73   Ht 5' 10.5 (1.791 m)   Wt 195 lb (88.5 kg)   SpO2 98%   BMI 27.58 kg/m   Physical Exam Constitutional:      General: He is not in acute distress. HENT:     Head: Normocephalic and atraumatic.     Right Ear: Tympanic membrane and external ear normal.     Left Ear: Tympanic membrane and external ear normal.  Eyes:     General: No scleral icterus. Neck:     Thyroid: No thyromegaly.  Cardiovascular:     Rate and Rhythm: Normal rate and regular rhythm.     Heart sounds: Normal heart sounds.  Pulmonary:     Effort: Pulmonary effort is normal.     Breath sounds: Normal breath sounds.  Abdominal:     General: Bowel sounds are normal. There is no distension.     Palpations: Abdomen is soft.     Tenderness: There is no abdominal tenderness. There is no guarding.  Musculoskeletal:     Cervical back: Normal range of motion.  Lymphadenopathy:     Cervical: No cervical adenopathy.  Skin:    General: Skin is warm and dry.     Findings: No rash.  Neurological:     Mental Status: He is alert and oriented to person, place, and time.     Cranial Nerves: No cranial nerve deficit.     Motor: No abnormal muscle tone.  Psychiatric:        Mood and Affect: Mood normal.        Behavior: Behavior normal.     ------------------------------------------------------------------------------------------------------------------------------------------------------------------------------------------------------------------- Assessment and Plan  Well adult exam Well adult Orders Placed This  Encounter  Procedures   CMP14+EGFR   CBC with Differential/Platelet   Lipid Panel With LDL/HDL Ratio   PSA   Testosterone    Vitamin D (25 hydroxy)   TSH  Screenings: Per lab orders.   Immunizations: Up-to-date Anticipatory guidance/risk factor reduction: Recommendations per AVS.  Elevated blood pressure reading Low sodium diet.  Add in exercise.  Return in 2 weeks for BP recheck.  Encouraged to check at home.    No orders of the defined types were placed in this encounter.   Return in about 3 weeks (around 07/16/2024) for nurse visit for BP check.

## 2024-06-25 NOTE — Patient Instructions (Addendum)
 Try magnesium glycinate or magnesium threonate   Preventive Care 42-42 Years Old, Male Preventive care refers to lifestyle choices and visits with your health care provider that can promote health and wellness. Preventive care visits are also called wellness exams. What can I expect for my preventive care visit? Counseling During your preventive care visit, your health care provider may ask about your: Medical history, including: Past medical problems. Family medical history. Current health, including: Emotional well-being. Home life and relationship well-being. Sexual activity. Lifestyle, including: Alcohol, nicotine or tobacco, and drug use. Access to firearms. Diet, exercise, and sleep habits. Safety issues such as seatbelt and bike helmet use. Sunscreen use. Work and work astronomer. Physical exam Your health care provider will check your: Height and weight. These may be used to calculate your BMI (body mass index). BMI is a measurement that tells if you are at a healthy weight. Waist circumference. This measures the distance around your waistline. This measurement also tells if you are at a healthy weight and may help predict your risk of certain diseases, such as type 2 diabetes and high blood pressure. Heart rate and blood pressure. Body temperature. Skin for abnormal spots. What immunizations do I need?  Vaccines are usually given at various ages, according to a schedule. Your health care provider will recommend vaccines for you based on your age, medical history, and lifestyle or other factors, such as travel or where you work. What tests do I need? Screening Your health care provider may recommend screening tests for certain conditions. This may include: Lipid and cholesterol levels. Diabetes screening. This is done by checking your blood sugar (glucose) after you have not eaten for a while (fasting). Hepatitis B test. Hepatitis C test. HIV (human immunodeficiency  virus) test. STI (sexually transmitted infection) testing, if you are at risk. Lung cancer screening. Prostate cancer screening. Colorectal cancer screening. Talk with your health care provider about your test results, treatment options, and if necessary, the need for more tests. Follow these instructions at home: Eating and drinking  Eat a diet that includes fresh fruits and vegetables, whole grains, lean protein, and low-fat dairy products. Take vitamin and mineral supplements as recommended by your health care provider. Do not drink alcohol if your health care provider tells you not to drink. If you drink alcohol: Limit how much you have to 0-2 drinks a day. Know how much alcohol is in your drink. In the [place] one drink equals one 12 oz bottle of beer (355 mL), one 5 oz glass of wine (148 mL), or one 1 oz glass of hard liquor (44 mL). Lifestyle Brush your teeth every morning and night with fluoride toothpaste. Floss one time each day. Exercise for at least 30 minutes 5 or more days each week. Do not use any products that contain nicotine or tobacco. These products include cigarettes, chewing tobacco, and vaping devices, such as e-cigarettes. If you need help quitting, ask your health care provider. Do not use drugs. If you are sexually active, practice safe sex. Use a condom or other form of protection to prevent STIs. Take aspirin only as told by your health care provider. Make sure that you understand how much to take and what form to take. Work with your health care provider to find out whether it is safe and beneficial for you to take aspirin daily. Find healthy ways to manage stress, such as: Meditation, yoga, or listening to music. Journaling. Talking to a trusted person. Spending time with friends and  family. Minimize exposure to UV radiation to reduce your risk of skin cancer. Safety Always wear your seat belt while driving or riding in a vehicle. Do not drive: If you have  been drinking alcohol. Do not ride with someone who has been drinking. When you are tired or distracted. While texting. If you have been using any mind-altering substances or drugs. Wear a helmet and other protective equipment during sports activities. If you have firearms in your house, make sure you follow all gun safety procedures. What's next? Go to your health care provider once a year for an annual wellness visit. Ask your health care provider how often you should have your eyes and teeth checked. Stay up to date on all vaccines. This information is not intended to replace advice given to you by your health care provider. Make sure you discuss any questions you have with your health care provider. Document Revised: 01/04/2021 Document Reviewed: 01/04/2021 Elsevier Patient Education  2024 [employer].

## 2024-06-26 LAB — PSA: Prostate Specific Ag, Serum: 0.7 ng/mL (ref 0.0–4.0)

## 2024-06-26 LAB — LIPID PANEL WITH LDL/HDL RATIO
Cholesterol, Total: 216 mg/dL — ABNORMAL HIGH (ref 100–199)
HDL: 42 mg/dL (ref >39)
LDL Chol Calc (NIH): 141 mg/dL — ABNORMAL HIGH (ref 0–99)
LDL/HDL Ratio: 3.4 ratio (ref 0.0–3.6)
Triglycerides: 182 mg/dL — ABNORMAL HIGH (ref 0–149)
VLDL Cholesterol Cal: 33 mg/dL (ref 5–40)

## 2024-06-26 LAB — CBC WITH DIFFERENTIAL/PLATELET
Basophils Absolute: 0 x10E3/uL (ref 0.0–0.2)
Basos: 1 %
EOS (ABSOLUTE): 0.1 x10E3/uL (ref 0.0–0.4)
Eos: 2 %
Hematocrit: 46.3 % (ref 37.5–51.0)
Hemoglobin: 15.9 g/dL (ref 13.0–17.7)
Immature Grans (Abs): 0 x10E3/uL (ref 0.0–0.1)
Immature Granulocytes: 0 %
Lymphocytes Absolute: 1.8 x10E3/uL (ref 0.7–3.1)
Lymphs: 36 %
MCH: 32.5 pg (ref 26.6–33.0)
MCHC: 34.3 g/dL (ref 31.5–35.7)
MCV: 95 fL (ref 79–97)
Monocytes Absolute: 0.4 x10E3/uL (ref 0.1–0.9)
Monocytes: 9 %
Neutrophils Absolute: 2.6 x10E3/uL (ref 1.4–7.0)
Neutrophils: 52 %
Platelets: 194 x10E3/uL (ref 150–450)
RBC: 4.89 x10E6/uL (ref 4.14–5.80)
RDW: 12.5 % (ref 11.6–15.4)
WBC: 4.9 x10E3/uL (ref 3.4–10.8)

## 2024-06-26 LAB — VITAMIN D 25 HYDROXY (VIT D DEFICIENCY, FRACTURES): Vit D, 25-Hydroxy: 39.2 ng/mL (ref 30.0–100.0)

## 2024-06-26 LAB — CMP14+EGFR
ALT: 48 IU/L — ABNORMAL HIGH (ref 0–44)
AST: 38 IU/L (ref 0–40)
Albumin: 4.8 g/dL (ref 4.1–5.1)
Alkaline Phosphatase: 48 IU/L (ref 47–123)
BUN/Creatinine Ratio: 8 — ABNORMAL LOW (ref 9–20)
BUN: 8 mg/dL (ref 6–24)
Bilirubin Total: 2.7 mg/dL — ABNORMAL HIGH (ref 0.0–1.2)
CO2: 24 mmol/L (ref 20–29)
Calcium: 9.6 mg/dL (ref 8.7–10.2)
Chloride: 100 mmol/L (ref 96–106)
Creatinine, Ser: 0.99 mg/dL (ref 0.76–1.27)
Globulin, Total: 2.4 g/dL (ref 1.5–4.5)
Glucose: 97 mg/dL (ref 70–99)
Potassium: 3.9 mmol/L (ref 3.5–5.2)
Sodium: 141 mmol/L (ref 134–144)
Total Protein: 7.2 g/dL (ref 6.0–8.5)
eGFR: 98 mL/min/1.73 (ref >59)

## 2024-06-26 LAB — TESTOSTERONE: Testosterone: 398 ng/dL (ref 264–916)

## 2024-06-26 LAB — TSH: TSH: 1.56 u[IU]/mL (ref 0.450–4.500)

## 2024-06-27 ENCOUNTER — Encounter (HOSPITAL_BASED_OUTPATIENT_CLINIC_OR_DEPARTMENT_OTHER): Payer: Self-pay | Admitting: Urology

## 2024-06-27 ENCOUNTER — Emergency Department (HOSPITAL_BASED_OUTPATIENT_CLINIC_OR_DEPARTMENT_OTHER)
Admission: EM | Admit: 2024-06-27 | Discharge: 2024-06-27 | Disposition: A | Discharge status: Home / Self Care | Attending: Emergency Medicine | Admitting: Emergency Medicine

## 2024-06-27 ENCOUNTER — Other Ambulatory Visit: Payer: Self-pay

## 2024-06-27 DIAGNOSIS — Z79899 Other long term (current) drug therapy: Secondary | ICD-10-CM | POA: Insufficient documentation

## 2024-06-27 DIAGNOSIS — I159 Secondary hypertension, unspecified: Secondary | ICD-10-CM | POA: Insufficient documentation

## 2024-06-27 MED ORDER — LISINOPRIL 10 MG PO TABS
10.0000 mg | ORAL_TABLET | Freq: Once | ORAL | Status: AC
  Administered 2024-06-27: 10 mg via ORAL
  Filled 2024-06-27: qty 1

## 2024-06-27 MED ORDER — LISINOPRIL 10 MG PO TABS: 10.0000 mg | ORAL_TABLET | Freq: Every day | ORAL | 0 refills | Status: DC

## 2024-06-27 NOTE — Discharge Instructions (Signed)
 Take blood pressures in the morning prior to food or Caffeine intake. Take lisinopril  10 mg daily. Decrease salt, alcohol, and caffeine intake. Follow-up with your primary care physician in 2 weeks to monitor blood pressure and medication.

## 2024-06-27 NOTE — ED Triage Notes (Signed)
 Pt states was seen Thursday for physical and BP was high 162/100.  Hasn't been feeling well today  Was 169/102 at home today  States noticed facial tingling to chin

## 2024-06-27 NOTE — ED Provider Notes (Signed)
 [place] [facility] Provider Note   CSN: [id number] Arrival date & time: 06/27/24  1851     Patient presents with: Hypertension   Wayne [name] is a 43 y.o. male.   Patient is a 43 year old male presenting for high blood pressure.  Patient denies tinnitus, vision changes, headaches, nausea, chest pain, or chest tightness.  Patient admits to pins and needle sensation in bilateral face.  He states his blood pressure is elevated yesterday and today.  He is using a home blood pressure cuff.  He followed recently with his PCP and had a blood pressure of 180 systolic.  Was recommended for repeat blood pressure on follow-up appointment.  No medications initiated currently.  Blood pressure on arrival 175/99.  Patient did have some alcoholic beverages last night.  Mild caffeine content including 1 coffee in the morning and a Coke in the afternoon.  Salt intake unknown.  No leg swelling.  The history is provided by the patient. No language interpreter was used.  Hypertension Pertinent negatives include no chest pain, no abdominal pain and no shortness of breath.       Prior to Admission medications   Medication Sig Start Date End Date Taking? Authorizing Provider  lisinopril  (ZESTRIL ) 10 MG tablet Take 1 tablet (10 mg total) by mouth daily. 06/27/24  Yes Elnor Hila P, DO  fluticasone  (FLONASE ) 50 MCG/ACT nasal spray Place 2 sprays into both nostrils daily. 06/09/19   Berneta Elsie Sayre, MD    Allergies: Patient has no known allergies.    Review of Systems  Constitutional:  Negative for chills and fever.  HENT:  Negative for ear pain and sore throat.   Eyes:  Negative for pain and visual disturbance.  Respiratory:  Negative for cough and shortness of breath.   Cardiovascular:  Negative for chest pain and palpitations.  Gastrointestinal:  Negative for abdominal pain and vomiting.  Genitourinary:  Negative for dysuria and hematuria.  Musculoskeletal:   Negative for arthralgias and back pain.  Skin:  Negative for color change and rash.  Neurological:  Negative for seizures and syncope.  All other systems reviewed and are negative.   Updated Vital Signs BP (!) 156/95   Pulse 73   Temp (!) 97.5 F (36.4 C)   Resp 18   Ht 5' 10 (1.778 m)   Wt 88.5 kg   SpO2 100%   BMI 27.99 kg/m   Physical Exam Vitals and nursing note reviewed.  Constitutional:      General: He is not in acute distress.    Appearance: He is well-developed.  HENT:     Head: Normocephalic and atraumatic.  Eyes:     Conjunctiva/sclera: Conjunctivae normal.  Cardiovascular:     Rate and Rhythm: Normal rate and regular rhythm.     Heart sounds: No murmur heard. Pulmonary:     Effort: Pulmonary effort is normal. No respiratory distress.     Breath sounds: Normal breath sounds.  Abdominal:     Palpations: Abdomen is soft.     Tenderness: There is no abdominal tenderness.  Musculoskeletal:        General: No swelling.     Cervical back: Neck supple.  Skin:    General: Skin is warm and dry.     Capillary Refill: Capillary refill takes less than 2 seconds.  Neurological:     Mental Status: He is alert.  Psychiatric:        Mood and Affect: Mood normal.     (  all labs ordered are listed, but only abnormal results are displayed) Labs Reviewed - No data to display  EKG: None  Radiology: No results found.   Procedures   Medications Ordered in the ED  lisinopril  (ZESTRIL ) tablet 10 mg (has no administration in time range)                                    Medical Decision Making Risk Prescription drug management.   43 year old male presenting for high blood pressure.  Patient is a very pleasant 43 year old male, afebrile, stable vital signs.  Blood pressure 175/99 on arrival.  Went down to 156/95 before intervention while resting in the room.  Twelve-lead EKG demonstrates sinus rhythm.  No cardiac abnormalities.  No cardiac murmurs.  Equal  bilateral breath sounds with no adventitious lung sounds.  No lower extremity edema.  He is neurovascularly intact.  Pins and needle sensation in bilateral face likely associated with blood pressure readings.  No signs or symptoms of stroke at this time.  He had blood work on 06/25/2024 with his PCP which demonstrated stable kidney function.  Mild elevation in ALT in the 40s.  Otherwise stable.  Patient recommended to adhere to a low caffeine, low salt, low alcohol diet.  Recommended for diet and exercise.  Lisinopril  10 mg started.  Recommend for close follow-up with PCP in the next 2 weeks.  Of note, patient also had hypercholesteremia.  Will defer to PCP for statin therapy.  Patient in no distress and overall condition improved here in the ED. Detailed discussions were had with the patient regarding current findings, and need for close f/u with PCP or on call doctor. The patient has been instructed to return immediately if the symptoms worsen in any way for re-evaluation. Patient verbalized understanding and is in agreement with current care plan. All questions answered prior to discharge.      Final diagnoses:  Secondary hypertension    ED Discharge Orders          Ordered    lisinopril  (ZESTRIL ) 10 MG tablet  Daily        06/27/24 2145               Elnor Bernarda SQUIBB, DO 06/27/24 2145

## 2024-07-06 NOTE — Progress Notes (Unsigned)
°  [name] [address] [place] [address] Phone: [phone] Subjective:   LILLETTE Claretha Schimke am a scribe for Dr. Claudene.   I'm seeing this patient by the request  of:  Alvia Bring, DO  CC: Back and neck pain follow-up  YEP:Dlagzrupcz  Jaymar Loeber is a 43 y.o. male coming in with complaint of back and neck pain. OMT 05/07/2024. Patient states that it is better than it was last time. It is tight in the right shoulder area.   Medications patient has been prescribed: None  Taking:         Reviewed prior external information including notes and imaging from previsou exam, outside providers and external EMR if available.   As well as notes that were available from care everywhere and other healthcare systems.  Past medical history, social, surgical and family history all reviewed in electronic medical record.  No pertanent information unless stated regarding to the chief complaint.   Past Medical History:  Diagnosis Date   Sinus congestion     Allergies[1]   Review of Systems:  No headache, visual changes, nausea, vomiting, diarrhea, constipation, dizziness, abdominal pain, skin rash, fevers, chills, night sweats, weight loss, swollen lymph nodes, body aches, joint swelling, chest pain, shortness of breath, mood changes. POSITIVE muscle aches  Objective  Blood pressure 128/70, pulse 72, height 5' 10 (1.778 m), weight 198 lb (89.8 kg), SpO2 98%.   General: No apparent distress alert and oriented x3 mood and affect normal, dressed appropriately.  HEENT: Pupils equal, extraocular movements intact  Respiratory: Patient's speak in full sentences and does not appear short of breath  Cardiovascular: No lower extremity edema, non tender, no erythema  Gait MSK:  Back tenderness in the scapular area.  Patient's neck exam shows some mild limitation in sidebending bilaterally.  Negative Spurling's on the neck.  Low back does have some loss of  lordosis but negative straight leg test noted today.  Osteopathic findings  C2 flexed rotated and side bent right C6 flexed rotated and side bent left T3 extended rotated and side bent right inhaled rib T9 extended rotated and side bent left L1 flexed rotated and side bent right Sacrum right on right       Assessment and Plan:  Scapular dyskinesis Continue to work on posture and ergonomics.  Has had some low back pain recently as well.  Given a prednisone  for patient travels.  He is going to [place].  Discussed icing regimen and home exercises, discussed which activities to do and which ones to avoid.  Increase activity slowly.  Discussed icing regimen.  Follow-up again in 6 to 12 weeks    Nonallopathic problems  Decision today to treat with OMT was based on Physical Exam  After verbal consent patient was treated with HVLA, ME, FPR techniques in cervical, rib, thoracic, lumbar, and sacral  areas  Patient tolerated the procedure well with improvement in symptoms  Patient given exercises, stretches and lifestyle modifications  See medications in patient instructions if given  Patient will follow up in 4-8 weeks     The above documentation has been reviewed and is accurate and complete Jakobie Henslee M Shrinika Blatz, DO         Note: This dictation was prepared with Dragon dictation along with smaller phrase technology. Any transcriptional errors that result from this process are unintentional.            [1] No Known Allergies

## 2024-07-07 ENCOUNTER — Ambulatory Visit: Admitting: Family Medicine

## 2024-07-07 ENCOUNTER — Encounter: Payer: Self-pay | Admitting: Family Medicine

## 2024-07-07 VITALS — BP 128/70 | HR 72 | Ht 70.0 in | Wt 198.0 lb

## 2024-07-07 DIAGNOSIS — M9902 Segmental and somatic dysfunction of thoracic region: Secondary | ICD-10-CM

## 2024-07-07 DIAGNOSIS — M9903 Segmental and somatic dysfunction of lumbar region: Secondary | ICD-10-CM

## 2024-07-07 DIAGNOSIS — M9904 Segmental and somatic dysfunction of sacral region: Secondary | ICD-10-CM

## 2024-07-07 DIAGNOSIS — M9901 Segmental and somatic dysfunction of cervical region: Secondary | ICD-10-CM

## 2024-07-07 DIAGNOSIS — G2589 Other specified extrapyramidal and movement disorders: Secondary | ICD-10-CM

## 2024-07-07 DIAGNOSIS — M9908 Segmental and somatic dysfunction of rib cage: Secondary | ICD-10-CM | POA: Diagnosis not present

## 2024-07-07 MED ORDER — PREDNISONE 20 MG PO TABS: 20.0000 mg | ORAL_TABLET | Freq: Two times a day (BID) | ORAL | 0 refills | Status: AC

## 2024-07-07 NOTE — Patient Instructions (Addendum)
 Shave to see you. Happy Holidays. Have fun in [place]! Prednisone  20 mg. Keep me updated.  See me again in 2 months.

## 2024-07-07 NOTE — Assessment & Plan Note (Signed)
 Continue to work on air cabin crew.  Has had some low back pain recently as well.  Given a prednisone  for patient travels.  He is going to [place].  Discussed icing regimen and home exercises, discussed which activities to do and which ones to avoid.  Increase activity slowly.  Discussed icing regimen.  Follow-up again in 6 to 12 weeks

## 2024-07-13 NOTE — Progress Notes (Unsigned)
" ° °  Subjective:    Patient ID: [name], male    DOB: 11-27-1980, 43 y.o.   MRN: [id number]  HPI  Patient is here for a 3wk BP recheck. Last OV here it was 165/116. Patient currently takes lisinopril  10 mg. Denies CP, SOB, medication changes, heart palpitations, or vision changes.  Review of Systems     Objective:   Physical Exam        Assessment & Plan:   Patients first BP reading is second is reported to PCP Dr. Alvia  "

## 2024-07-14 ENCOUNTER — Ambulatory Visit

## 2024-07-14 ENCOUNTER — Other Ambulatory Visit: Payer: Self-pay | Admitting: Family Medicine

## 2024-07-14 VITALS — BP 125/75 | HR 75 | Resp 18 | Ht 70.0 in | Wt 198.0 lb

## 2024-07-14 DIAGNOSIS — R030 Elevated blood-pressure reading, without diagnosis of hypertension: Secondary | ICD-10-CM

## 2024-07-14 DIAGNOSIS — I10 Essential (primary) hypertension: Secondary | ICD-10-CM

## 2024-07-14 MED ORDER — LISINOPRIL 10 MG PO TABS: 10.0000 mg | ORAL_TABLET | Freq: Every day | ORAL | 1 refills | Status: AC

## 2024-07-15 LAB — BASIC METABOLIC PANEL WITH GFR
BUN/Creatinine Ratio: 10 (ref 9–20)
BUN: 10 mg/dL (ref 6–24)
CO2: 24 mmol/L (ref 20–29)
Calcium: 9.8 mg/dL (ref 8.7–10.2)
Chloride: 101 mmol/L (ref 96–106)
Creatinine, Ser: 1.01 mg/dL (ref 0.76–1.27)
Glucose: 90 mg/dL (ref 70–99)
Potassium: 4.3 mmol/L (ref 3.5–5.2)
Sodium: 142 mmol/L (ref 134–144)
eGFR: 95 mL/min/1.73

## 2024-07-16 ENCOUNTER — Ambulatory Visit: Payer: Self-pay | Admitting: Family Medicine

## 2024-09-09 ENCOUNTER — Ambulatory Visit: Admitting: Family Medicine
